# Patient Record
Sex: Female | Born: 1935 | Race: White | Hispanic: No | Smoking: Never smoker
Health system: Southern US, Community
[De-identification: ages and names within clinical notes are randomized; demographics above are authoritative.]

## PROBLEM LIST (undated history)

## (undated) DIAGNOSIS — I10 Essential (primary) hypertension: Secondary | ICD-10-CM

## (undated) DIAGNOSIS — E119 Type 2 diabetes mellitus without complications: Secondary | ICD-10-CM

## (undated) DIAGNOSIS — E079 Disorder of thyroid, unspecified: Secondary | ICD-10-CM

## (undated) DIAGNOSIS — I4891 Unspecified atrial fibrillation: Secondary | ICD-10-CM

---

## 1997-07-19 ENCOUNTER — Ambulatory Visit (HOSPITAL_COMMUNITY): Admission: RE | Admit: 1997-07-19 | Discharge: 1997-07-19 | Payer: Self-pay | Admitting: Family Medicine

## 2000-09-01 ENCOUNTER — Encounter: Payer: Self-pay | Admitting: Family Medicine

## 2000-09-01 ENCOUNTER — Ambulatory Visit (HOSPITAL_COMMUNITY): Admission: RE | Admit: 2000-09-01 | Discharge: 2000-09-01 | Payer: Self-pay | Admitting: Family Medicine

## 2000-09-02 ENCOUNTER — Ambulatory Visit (HOSPITAL_COMMUNITY): Admission: RE | Admit: 2000-09-02 | Discharge: 2000-09-02 | Payer: Self-pay | Admitting: Family Medicine

## 2000-09-02 ENCOUNTER — Encounter: Payer: Self-pay | Admitting: Family Medicine

## 2000-11-19 ENCOUNTER — Ambulatory Visit (HOSPITAL_COMMUNITY): Admission: RE | Admit: 2000-11-19 | Discharge: 2000-11-19 | Payer: Self-pay | Admitting: Family Medicine

## 2000-11-19 ENCOUNTER — Encounter: Payer: Self-pay | Admitting: Family Medicine

## 2002-02-02 ENCOUNTER — Inpatient Hospital Stay (HOSPITAL_COMMUNITY): Admission: EM | Admit: 2002-02-02 | Discharge: 2002-02-07 | Payer: Self-pay | Admitting: Emergency Medicine

## 2002-02-02 ENCOUNTER — Encounter: Payer: Self-pay | Admitting: Orthopedic Surgery

## 2002-02-02 ENCOUNTER — Encounter: Payer: Self-pay | Admitting: Emergency Medicine

## 2004-04-23 ENCOUNTER — Inpatient Hospital Stay (HOSPITAL_COMMUNITY): Admission: RE | Admit: 2004-04-23 | Discharge: 2004-04-26 | Payer: Self-pay | Admitting: Orthopedic Surgery

## 2005-01-19 ENCOUNTER — Encounter: Admission: RE | Admit: 2005-01-19 | Discharge: 2005-01-19 | Payer: Self-pay | Admitting: Orthopedic Surgery

## 2005-06-03 ENCOUNTER — Encounter: Admission: RE | Admit: 2005-06-03 | Discharge: 2005-06-03 | Payer: Self-pay | Admitting: Orthopedic Surgery

## 2005-06-04 ENCOUNTER — Ambulatory Visit (HOSPITAL_BASED_OUTPATIENT_CLINIC_OR_DEPARTMENT_OTHER): Admission: RE | Admit: 2005-06-04 | Discharge: 2005-06-04 | Payer: Self-pay | Admitting: Orthopedic Surgery

## 2006-03-31 ENCOUNTER — Other Ambulatory Visit: Admission: RE | Admit: 2006-03-31 | Discharge: 2006-03-31 | Payer: Self-pay | Admitting: *Deleted

## 2008-10-18 ENCOUNTER — Other Ambulatory Visit: Admission: RE | Admit: 2008-10-18 | Discharge: 2008-10-18 | Payer: Self-pay | Admitting: Family Medicine

## 2010-02-02 ENCOUNTER — Encounter: Payer: Self-pay | Admitting: Gastroenterology

## 2010-05-30 NOTE — Discharge Summary (Signed)
   NAME:  Shelby Gay, Shelby Gay                        ACCOUNT NO.:  [id number]   MEDICAL RECORD NO.:  [id number]                   PATIENT TYPE:  INP   LOCATION:  [id number]                                 FACILITY:  [facility]   PHYSICIAN:  Anderson Malta, M.D.                 DATE OF BIRTH:  Jun 28, 1930   DATE OF ADMISSION:  02/02/2002  DATE OF DISCHARGE:  02/07/2002                                 DISCHARGE SUMMARY   DISCHARGE DIAGNOSIS:  1. Right ankle trimalleolar fracture.   SECONDARY DIAGNOSES:  1. Hypertension.  2. Arthritis.   OPERATIONS/PROCEDURES:  Open reduction and internal fixation of right ankle  fracture performed on 02/02/02.   HOSPITAL COURSE:  Shelby Gay is a 77 year old patient who injured her  right ankle on 02/02/02.  She underwent right ankle open reduction and  internal fixation on 02/02/02.  She tolerated the procedure well.  Internal  medicine consultation was obtained.  The patient's toes were mobile and  perfused on postoperative day number one.   The patient was started on Coumadin for DVT prophylaxis.  Physical therapy  saw the patient for mobilization, non weight bearing on the right lower  extremity.  Postoperative day three showed the hardware  to be in good  position.  The patient had an uneventful recovery.  She was medically stable  by postoperative day number three.  She was discharged home on 02/07/02 in  good condition.   DISCHARGE MEDICATIONS:  Percocet and Coumadin plus her preadmission  medications.   FOLLOW UP:  She will follow up in my office in a week for inspection of the  incisions.  She will maintain a non weight bearing status.                                               Anderson Malta, M.D.    GSD/MEDQ  D:  03/14/2002  T:  03/15/2002  Job:  [id number]

## 2010-05-30 NOTE — Op Note (Signed)
NAMELAJEANNA, LANCON              ACCOUNT NO.:  [id number]   MEDICAL RECORD NO.:  [id number]          PATIENT TYPE:  INP   LOCATION:  2550                         FACILITY:  [facility]   PHYSICIAN:  Newt Minion, MD     DATE OF BIRTH:  March 10, 1930   DATE OF PROCEDURE:  04/23/2004  DATE OF DISCHARGE:                                 OPERATIVE REPORT   PREOPERATIVE DIAGNOSES:  1.  Status post ORIF right ankle fracture.  2.  Right ankle arthrosis.   PROCEDURE:  1.  Removal retained hardware.  2.  Open reduction internal fixation right ankle.   SURGEON.:  Newt Minion, MD   ANESTHESIA:  General.   ESTIMATED BLOOD LOSS:  Minimal.   ANTIBIOTICS:  1 gram of Kefzol.   DRAINS:  None.   COMPLICATIONS:  None.   TOURNIQUET TIME:  Esmarch at the ankle for approximately 60 minutes.   DISPOSITION:  To PACU in stable condition.   INDICATIONS FOR PROCEDURE:  The patient is a 77 year old woman who is status  post ORIF of a right ankle fracture. She had subsequently developed  arthrosis and pain with activities of daily living and presents at this time  for right ankle fusion. The risks and benefits were discussed including  infection, neurovascular injury, persistent pain, need for additional  surgery, nonhealing of the wounds. The patient states she understands and  wishes to proceed at this time.   DESCRIPTION OF PROCEDURE:  The patient is brought to OR room 5 underwent  general anesthetic.  After an adequate level of anesthesia was obtained, the  patient's right lower extremity was prepped using DuraPrep, draped in a  sterile field.  [name] was used to cover all exposed skin. An Esmarch was  wrapped around the calf for tourniquet control. The lateral incision was  used and this was carried down to the hardware.  The hardware was removed.  The distal fibula was excised. A similar incision was made over the previous  surgical incision medially and a medial malleolus was also excised.  The foot  was held in 90 degrees and the distal tibia and talar dome were then planed  using oscillating saw with parallel saw cuts with the ankle at 90 degrees.  The bone was removed. The wound was irrigated with normal saline and the  ankle joint was compressed with good bony contact of the ankle at 90  degrees.  A locking humeral plate was applied laterally to stabilize the  ankle laterally and a 7.3 cannulated screw was placed medially to stabilize  the ankle medially. C-arm fluoroscopy was used to verify reduction in both  AP and lateral planes.  Again the wound was irrigated. Using the AlloMatrix  demineralized bone matrix and bone graft, the fusion site was packed with  bone graft posteriorly. The Esmarch was released. Hemostasis was obtained.  The subcu was closed using 2-0 Vicryl.  The skin was closed using Proximate  staples. The wound was covered with Adaptic orthopedic sponges, ABD  dressing, Webril and Coban. The patient was extubated, taken to PACU in  stable condition.  MVD/MEDQ  D:  04/23/2004  T:  04/23/2004  Job:  [id number]

## 2010-05-30 NOTE — Op Note (Signed)
NAMEDEONICA, VANTOL              ACCOUNT NO.:  [id number]   MEDICAL RECORD NO.:  [id number]          PATIENT TYPE:  AMB   LOCATION:  [place]                          FACILITY:  [facility]   PHYSICIAN:  Youlanda Mighty. Sypher, M.D. DATE OF BIRTH:  [birth year]   DATE OF PROCEDURE:  06/04/2005  DATE OF DISCHARGE:                                 OPERATIVE REPORT   PREOP DIAGNOSIS:  1.  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  2.  Chronic left long finger stenosing tenosynovitis.  3.  Chronic left ring finger stenosing tenosynovitis.   POSTOP DIAGNOSIS:  1.  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  2.  Chronic left long finger stenosing tenosynovitis.  3.  Chronic left ring finger stenosing tenosynovitis.   OPERATION:  1.  Release of left transverse carpal ligament.  2.  Through separate incision a release of left long finger A1 pulley and      synovectomy of flexor tendons.  3.  Release of a right ring finger A1 pulley.   OPERATING SURGEON:  Youlanda Mighty. Sypher, M.D.   ASSISTANT:  Marily Lente. Dasnoit, P.A.-C.   ANESTHESIA:  General by LMA.   SUPERVISING ANESTHESIOLOGIST:  Leda Quail, M.D.   INDICATIONS:  Shelby Gay is a 77 year old woman referred for evaluation  and management of bilateral hand symptoms.  Clinical examination revealed  stenosing tenosynovitis of her right ring finger; and her left long finger  and signs of entrapment neuropathy.  Due to a failure to respond to  nonoperative measures; she is brought to the operating room at this time for  release of her right ring finger A1 pulley, her right long finger A1 pulley,  and release of her left transverse carpal ligament.  After informed consent,  she is brought to the operating room at this time.   PROCEDURE:  Maret Diaz is brought to the operating room and placed in  the supine position on the operating table.  Following the induction of  general anesthesia by LMA technique.  The right  and left arms were prepped  with Betadine soap and solution and sterilely draped.  A pneumatic  tourniquet was applied to the proximal right forearm and the proximal left  brachium.  After exsanguination of the right hand with an Esmarch bandage, the arterial  tourniquet was inflated to 270 mmHg due to systolic hypertension.   The procedure commenced with a short transverse incision directly over the  thickened pulley.  The subcutaneous tissues were carefully divided taking  care to identify the proper digital nerves to the ring finger which were  gently retracted.  The A1 pulley was cleared with a Valora Corporal and released with  scalpel and scissors along its radial border.  The wound was irrigated and  subsequently repaired with intradermal 3-0 Prolene suture.   Attention was then directed to the left hand.  The left arm was  exsanguinated with an Esmarch bandage; and the arterial tourniquet in the  proximal brachium was inflated to 270 mmHg.  The procedure commenced with a  short incision in the line of the ring  finger and the palm.  The  subcutaneous tissues were carefully divided below the palmar fascia.  This  was split longitudinally down to the comprehensive branches of the median  nerve.  These were followed back to transverse carpal ligament which was  gently isolated from the median nerve with a Penfield-4 elevator.  The  ligament was released along its ulnar border extending into the distal  forearm.  This widely opened the carpal canal.  No masses or other  predicaments were noted.  Bleeding points along the margin of the released  ligament were electrocauterized with bipolar current followed by repair of  the skin with intradermal 3-0 Prolene.   Attention was directed to the left long finger.  A short oblique incision  was fashioned directly over the palpably thickened pulley.  The subcutaneous  tissues were carefully divided, taking care to gently retract the proper  digital  nerves to the left long finger.  The pulley was isolated and a Valora Corporal  was used to clear synovial tissues, followed by release of the pulley along  its radial border.  The flexion tendon was delivered.  The superficialis  tendon had considerable degenerative change and several fragments of  hypertrophic synovium.  These were gently cleaned with a rongeur.  Thereafter free range of motion of the finger was recovered with full  passive-flexion extension.   The palmar wound was then repaired with intradermal 3-0 Prolene.  Then 2%  lidocaine was infiltrated into the wound margins for postoperative  analgesia.  There no apparent complications.  Ms. Sneider was placed in a  compressive dressing on the right; and a compressive dressing to the volar  plaster splint on the left.  Tourniquet was released with immediate  capillary refill to the right and left hands.  There no apparent  complications.  Note, that she return to the office for followup in 1 week  for dressing change and suture removal.      Youlanda Mighty. Sypher, M.D.  Electronically Signed     RVS/MEDQ  D:  06/04/2005  T:  06/04/2005  Job:  [id number]

## 2010-05-30 NOTE — H&P (Signed)
   NAME:  Shelby Gay, HNAT                        ACCOUNT NO.:  [id number]   MEDICAL RECORD NO.:  [id number]                   PATIENT TYPE:  INP   LOCATION:  1856                                 FACILITY:  [facility]   PHYSICIAN:  Anderson Malta, M.D.                 DATE OF BIRTH:  02-05-1930   DATE OF ADMISSION:  02/02/2002  DATE OF DISCHARGE:                                HISTORY & PHYSICAL   CHIEF COMPLAINT:  Right Ankle pain.   HISTORY OF PRESENT ILLNESS:  The patient is a 77 year old ambulatory white  female who tripped on a wire at 1:30 today.  She complains of right ankle  pain.  She denies any other orthopedic complaints.   PAST MEDICAL HISTORY:  Notable for hypertension and arthritis.   PAST SURGICAL HISTORY:  Remote ovarian cyst excision.   CURRENT MEDICATIONS:  Norvasc and Relafen.   ALLERGIES:  She has no known drug allergies.   PRIMARY CARE PHYSICIAN:  Biagio Borg, M.D.  She has had yearly  physicals.   PHYSICAL EXAMINATION:  GENERAL:  She is alert and oriented x 3.  CHEST:  Clear to auscultation.  HEART:  Regular rate and rhythm.  ABDOMEN:  Benign.  EXTREMITIES:  Right lower extremity is splinted.  She does have intact  flexion and extension of the toes but decreased sensation on the dorsal  aspect of her foot.  Her foot is perfused.  She has no pain with range of  motion of the right or left knee or hip.  There is no effusion in either  knee.   LABORATORY DATA:  Chest x-ray is clear.  EKG shows questionable old septal  infarct.  X-ray shows a trimalleolar ankle fracture with mild displacement.   Hemoglobin 14.7, white count 7.5, platelets 138.  Sodium 143 and potassium  3.5.  BUN and creatinine 13 and 0.8.  PT and PT 13.7 and 29.   IMPRESSION:  Right trimalleolar ankle fracture.   PLAN:  Open reduction/internal fixation.  Risks and benefits are discussed.  Primary risk include infection, nerve and vessel damage, nonunion, malunion,  DVT, as well as  need for more surgery.  These complications are not all  inclusive.  The patient understands the risks and benefits and will proceed  with surgery.                                               Anderson Malta, M.D.    GSD/MEDQ  D:  02/02/2002  T:  02/02/2002  Job:  [id number]

## 2010-05-30 NOTE — Discharge Summary (Signed)
NAMEMALANIA, Shelby Gay              ACCOUNT NO.:  [id number]   MEDICAL RECORD NO.:  [id number]          PATIENT TYPE:  INP   LOCATION:  5022                         FACILITY:  [facility]   PHYSICIAN:  Newt Minion, MD     DATE OF BIRTH:  [birth year]   DATE OF ADMISSION:  04/23/2004  DATE OF DISCHARGE:  04/26/2004                                 DISCHARGE SUMMARY   DIAGNOSIS:  Right ankle arthritis.   PROCEDURE:  Right ankle fusion and removal of retained hardware.   CONDITION ON DISCHARGE:  Discharged to home in stable condition on aspirin  for DVT prophylaxis and Tylox for pain.   FOLLOW UP:  Plan to follow-up in office in one week.   HISTORY OF PRESENT ILLNESS:  The patient is a 77 year old with right ankle  arthritis status post internal fixation for ankle fracture. The patient has  had progressive pain with activities of daily living, has failed  conservative care, and presents at this time for surgical intervention. The  patient underwent removal of retained hardware and right ankle fusion on  April 23, 2004. The patient received Kefzol for infection prophylaxis and a  tourniquet time was 60 minutes with an esmarch at the ankle. The patient was  treated with prophylactic antibiotics for 24 hours. The patient progressed  well postoperatively. The patient states that there was a wheelchair at home  and anticipate the patient will need to use a wheelchair at discharge. The  patient was seen by physical therapy for transfer training. The patient was  slow with transfer training and was discharged to home in stable condition  on April 26, 2004 with follow-up in the office in one week.       MVD/MEDQ  D:  06/25/2004  T:  06/25/2004  Job:  [id number]

## 2010-05-30 NOTE — Op Note (Signed)
NAME:  Shelby Gay, Shelby Gay                        ACCOUNT NO.:  [id number]   MEDICAL RECORD NO.:  [id number]                   PATIENT TYPE:  INP   LOCATION:  5012                                 FACILITY:  [facility]   PHYSICIAN:  Anderson Malta, M.D.                 DATE OF BIRTH:  1930-07-01   DATE OF PROCEDURE:  DATE OF DISCHARGE:                                 OPERATIVE REPORT   PREOPERATIVE DIAGNOSIS:  Right trimalleolar ankle fracture.   POSTOPERATIVE DIAGNOSIS:  Right trimalleolar ankle fracture.   PROCEDURE:  Right ankle open reduction and internal fixation of the lateral  medial malleolus.   SURGEON:  Dr. Alphonzo Severance.   ANESTHESIA:  General endotracheal.   ESTIMATED BLOOD LOSS:  25 cc.   TOURNIQUET TIME:  68 minutes at 300 mmHg.   PROCEDURE IN DETAIL:  The patient was brought to the operating room where  general endotracheal anesthesia was induced.  Preoperative IV antibiotics  were administered.  Right ankle and leg were prepped with Duraprep solution  and draped in a sterile manner.  [name] was used to cover the operative  field.  The leg was elevated and exsanguinated with the Esmarch.  After  tourniquet was inflated, lateral incision to the fibula was made.  Skin and  subcutaneous tissue was sharply divided.  Superficial peroneal nerve was  identified and protected.  Full thickness periosteal flaps were developed  around the fracture site.  The fracture was then reduced under direct  visualization and a lag screw was placed.  An eight hole, one third tubular  locking plate was then placed in what was osteoporotic bone.  This gave an  excellent fixation which was checked in the AP and lateral planes under  fluoroscopy.  At this time, a medial J-shaped incision was made on the  medial side and was centered over the fracture site.  Skin and subcutaneous  tissues were sharply divided.  Branches of the saphenous vein were  preserved.  Fracture was identified and reduced  under direct visualization.  Two guide pins from the 4-0 cannulated screw set were placed.  Reduction was  confirmed in the AP and lateral planes.  Two 4 mm screws were then placed  across the fracture site with good compression and anatomic reduction  achieved.  At this time, a tourniquet was released.  Both incisions were  thoroughly irrigated.  Lateral incision was closed using interrupted,  inverted 2-0 Vicryl suture and skin with simple 3-0 nylon sutures.  Medial  side was also closed using interrupted inverted 3-0 Vicryl suture and 3-0  nylon suture.  The patient had a good pedal pulse and good perfusion.  The  patient tolerated the procedure well, without immediate complications.  She  was placed in a posterior splint with the ankle in neutral dorsiflexion.  Anderson Malta, M.D.    GSD/MEDQ  D:  02/06/2002  T:  02/06/2002  Job:  [id number]

## 2011-04-06 ENCOUNTER — Other Ambulatory Visit (HOSPITAL_COMMUNITY)
Admission: RE | Admit: 2011-04-06 | Discharge: 2011-04-06 | Disposition: A | Discharge status: Home / Self Care | Payer: Medicare Other | Source: Ambulatory Visit | Attending: Family Medicine | Admitting: Family Medicine

## 2011-04-06 ENCOUNTER — Other Ambulatory Visit: Payer: Self-pay | Admitting: Family Medicine

## 2011-04-06 DIAGNOSIS — Z124 Encounter for screening for malignant neoplasm of cervix: Secondary | ICD-10-CM | POA: Insufficient documentation

## 2012-11-02 ENCOUNTER — Other Ambulatory Visit (HOSPITAL_COMMUNITY): Payer: Self-pay | Admitting: Family Medicine

## 2012-11-02 ENCOUNTER — Ambulatory Visit (HOSPITAL_COMMUNITY)
Admission: RE | Admit: 2012-11-02 | Discharge: 2012-11-02 | Disposition: A | Discharge status: Home / Self Care | Payer: Medicare Other | Source: Ambulatory Visit | Attending: Family Medicine | Admitting: Family Medicine

## 2012-11-02 DIAGNOSIS — M79609 Pain in unspecified limb: Secondary | ICD-10-CM

## 2012-11-02 DIAGNOSIS — M25451 Effusion, right hip: Secondary | ICD-10-CM

## 2012-11-02 DIAGNOSIS — M25521 Pain in right elbow: Secondary | ICD-10-CM

## 2012-11-02 DIAGNOSIS — X58XXXA Exposure to other specified factors, initial encounter: Secondary | ICD-10-CM | POA: Insufficient documentation

## 2012-11-02 DIAGNOSIS — R229 Localized swelling, mass and lump, unspecified: Secondary | ICD-10-CM | POA: Insufficient documentation

## 2012-11-02 DIAGNOSIS — S40029A Contusion of unspecified upper arm, initial encounter: Secondary | ICD-10-CM | POA: Insufficient documentation

## 2012-11-02 NOTE — Progress Notes (Signed)
VASCULAR LAB PRELIMINARY  PRELIMINARY  PRELIMINARY  PRELIMINARY  Right upper extremity venous duplex completed.    Preliminary report:  Right:  No evidence of DVT or superficial thrombosis.  There is a discrete area in the forearm at the area of pain/lump measuring 0.9 x 1.2 x 1.9 cm.  It is approx 14mm deep.  No vasculariztion noted in this area.  Reeder Brisby, RVT 11/02/2012, 2:52 PM

## 2013-01-26 ENCOUNTER — Encounter (HOSPITAL_BASED_OUTPATIENT_CLINIC_OR_DEPARTMENT_OTHER): Payer: Self-pay | Admitting: Emergency Medicine

## 2013-01-26 ENCOUNTER — Emergency Department (HOSPITAL_BASED_OUTPATIENT_CLINIC_OR_DEPARTMENT_OTHER): Payer: Medicare PPO

## 2013-01-26 ENCOUNTER — Emergency Department (HOSPITAL_BASED_OUTPATIENT_CLINIC_OR_DEPARTMENT_OTHER)
Admission: EM | Admit: 2013-01-26 | Discharge: 2013-01-26 | Disposition: A | Discharge status: Home / Self Care | Payer: Medicare PPO | Attending: Emergency Medicine | Admitting: Emergency Medicine

## 2013-01-26 DIAGNOSIS — IMO0002 Reserved for concepts with insufficient information to code with codable children: Secondary | ICD-10-CM | POA: Insufficient documentation

## 2013-01-26 DIAGNOSIS — S8990XA Unspecified injury of unspecified lower leg, initial encounter: Secondary | ICD-10-CM | POA: Insufficient documentation

## 2013-01-26 DIAGNOSIS — I4891 Unspecified atrial fibrillation: Secondary | ICD-10-CM | POA: Insufficient documentation

## 2013-01-26 DIAGNOSIS — M25562 Pain in left knee: Secondary | ICD-10-CM

## 2013-01-26 DIAGNOSIS — W19XXXA Unspecified fall, initial encounter: Secondary | ICD-10-CM

## 2013-01-26 DIAGNOSIS — W010XXA Fall on same level from slipping, tripping and stumbling without subsequent striking against object, initial encounter: Secondary | ICD-10-CM | POA: Insufficient documentation

## 2013-01-26 DIAGNOSIS — Y939 Activity, unspecified: Secondary | ICD-10-CM | POA: Insufficient documentation

## 2013-01-26 DIAGNOSIS — M546 Pain in thoracic spine: Secondary | ICD-10-CM

## 2013-01-26 DIAGNOSIS — Z79899 Other long term (current) drug therapy: Secondary | ICD-10-CM | POA: Insufficient documentation

## 2013-01-26 DIAGNOSIS — S99929A Unspecified injury of unspecified foot, initial encounter: Secondary | ICD-10-CM

## 2013-01-26 DIAGNOSIS — Z791 Long term (current) use of non-steroidal anti-inflammatories (NSAID): Secondary | ICD-10-CM | POA: Insufficient documentation

## 2013-01-26 DIAGNOSIS — I10 Essential (primary) hypertension: Secondary | ICD-10-CM | POA: Insufficient documentation

## 2013-01-26 DIAGNOSIS — E079 Disorder of thyroid, unspecified: Secondary | ICD-10-CM | POA: Insufficient documentation

## 2013-01-26 DIAGNOSIS — E119 Type 2 diabetes mellitus without complications: Secondary | ICD-10-CM | POA: Insufficient documentation

## 2013-01-26 DIAGNOSIS — S99919A Unspecified injury of unspecified ankle, initial encounter: Secondary | ICD-10-CM

## 2013-01-26 DIAGNOSIS — Y929 Unspecified place or not applicable: Secondary | ICD-10-CM | POA: Insufficient documentation

## 2013-01-26 HISTORY — DX: Disorder of thyroid, unspecified: E07.9

## 2013-01-26 HISTORY — DX: Type 2 diabetes mellitus without complications: E11.9

## 2013-01-26 HISTORY — DX: Essential (primary) hypertension: I10

## 2013-01-26 HISTORY — DX: Unspecified atrial fibrillation: I48.91

## 2013-01-26 MED ORDER — HYDROCODONE-ACETAMINOPHEN 5-325 MG PO TABS
1.0000 | ORAL_TABLET | Freq: Once | ORAL | Status: AC
  Administered 2013-01-26: 1 via ORAL
  Filled 2013-01-26: qty 1

## 2013-01-26 MED ORDER — HYDROCODONE-ACETAMINOPHEN 5-325 MG PO TABS: 1.0000 | ORAL_TABLET | Freq: Four times a day (QID) | ORAL | Status: AC | PRN

## 2013-01-26 NOTE — ED Provider Notes (Signed)
TIME SEEN: 11:00 AM  CHIEF COMPLAINT: Fall, right posterior back pain, left knee pain  HPI: Patient is a 80 year old female who has a history of diabetes, A. fib not on anticoagulation, hypertension, hypothyroidism who presents emergency department after a mechanical fall yesterday morning. She reports she slipped on ice on her rehabilitation and fell hurting her left knee and right posterior back. She was able to get up off the ground and ambulate afterwards. She states she has pain with movement. Pain is better with rest. She denies any head injury. She is not on anticoagulation. She denies any numbness, tingling or focal weakness. No bowel or bladder incontinence. No shortness of breath.  ROS: See HPI Constitutional: no fever  Eyes: no drainage  ENT: no runny nose   Cardiovascular:  no chest pain  Resp: no SOB  GI: no vomiting GU: no dysuria Integumentary: no rash  Allergy: no hives  Musculoskeletal: no leg swelling  Neurological: no slurred speech ROS otherwise negative  PAST MEDICAL HISTORY/PAST SURGICAL HISTORY:  Past Medical History  Diagnosis Date  . Atrial fibrillation   . Diabetes mellitus without complication   . Hypertension   . Thyroid disease     MEDICATIONS:  Prior to Admission medications   Medication Sig Start Date End Date Taking? Authorizing Provider  alendronate (FOSAMAX) 40 MG tablet Take 40 mg by mouth every 7 (seven) days. Take with a full glass of water on an empty stomach.   Yes Historical Provider, MD  amiodarone (PACERONE) 100 MG tablet Take 100 mg by mouth daily.   Yes Historical Provider, MD  aspirin 81 MG tablet Take 81 mg by mouth daily.   Yes Historical Provider, MD  cyclobenzaprine (FLEXERIL) 10 MG tablet Take 10 mg by mouth 3 (three) times daily as needed for muscle spasms.   Yes Historical Provider, MD  diltiazem (CARDIZEM CD) 180 MG 24 hr capsule Take 180 mg by mouth daily.   Yes Historical Provider, MD  levothyroxine (SYNTHROID, LEVOTHROID) 50  MCG tablet Take 50 mcg by mouth daily before breakfast.   Yes Historical Provider, MD  losartan-hydrochlorothiazide (HYZAAR) 100-25 MG per tablet Take 1 tablet by mouth daily.   Yes Historical Provider, MD  metFORMIN (GLUCOPHAGE) 500 MG tablet Take by mouth 2 (two) times daily with a meal.   Yes Historical Provider, MD  naproxen (NAPROSYN) 500 MG tablet Take 500 mg by mouth 2 (two) times daily with a meal.   Yes Historical Provider, MD  pravastatin (PRAVACHOL) 40 MG tablet Take 40 mg by mouth daily.   Yes Historical Provider, MD    ALLERGIES:  Not on File  SOCIAL HISTORY:  History  Substance Use Topics  . Smoking status: Never Smoker   . Smokeless tobacco: Not on file  . Alcohol Use: No    FAMILY HISTORY: History reviewed. No pertinent family history.  EXAM: BP 151/91  Pulse 86  Temp(Src) 98.2 F (36.8 C) (Oral)  Resp 20  Ht 5' (1.524 m)  Wt 150 lb (68.04 kg)  BMI 29.30 kg/m2  SpO2 100% CONSTITUTIONAL: Alert and oriented and responds appropriately to questions. Well-appearing; well-nourished; GCS 15 HEAD: Normocephalic; atraumatic EYES: Conjunctivae clear, PERRL, EOMI ENT: normal nose; no rhinorrhea; moist mucous membranes; pharynx without lesions noted; no dental injury; no septal hematoma NECK: Supple, no meningismus, no LAD; no midline spinal tenderness, step-off or deformity CARD: RRR; S1 and S2 appreciated; no murmurs, no clicks, no rubs, no gallops RESP: Normal chest excursion without splinting or tachypnea; breath sounds  clear and equal bilaterally; no wheezes, no rhonchi, no rales; chest wall stable, nontender to palpation ABD/GI: Normal bowel sounds; non-distended; soft, non-tender, no rebound, no guarding PELVIS:  stable, nontender to palpation BACK:  The back appears normal and patient is tender to palpation over the right posterior ribs with no ecchymosis or crepitus or skin lesions, there is no CVA tenderness; no midline spinal tenderness, step-off or  deformity EXT: Patient is tender to palpation over the medial joint line of the left knee and over the patella with minimal amount of medial swelling, no ecchymosis, full range of motion in her left knee, small joint effusion, no erythema or warmth or induration, 2+ DP pulses bilaterally, sensation to light touch intact diffusely, otherwise Normal ROM in all joints; non-tender to palpation; no edema; normal capillary refill; no cyanosis    SKIN: Normal color for age and race; warm NEURO: Moves all extremities equally normal gait, sensation to light touch intact diffusely, cranial nerves 2 through intact PSYCH: The patient's mood and manner are appropriate. Grooming and personal hygiene are appropriate.  MEDICAL DECISION MAKING: Patient here with mechanical fall. She is complaining of right posterior rib pain and left knee pain. We'll obtain x-rays and give pain medication. She is hemodynamically stable.  ED PROGRESS: Patient's x-ray show no acute fracture dislocation. She does have tricompartmental are separated seen on her x-ray of her knee in a moderate joint effusion.  Patient reports her pain is improved with Vicodin. We'll discharge home with return precautions, followup information, pain medication. Patient and daughter at bedside verbalize understanding and are comfortable plan.     [place], DO 01/26/13 1157

## 2013-01-26 NOTE — Discharge Instructions (Signed)
Knee Pain Knee pain can be a result of an injury or other medical conditions. Treatment will depend on the cause of your pain. HOME CARE  Only take medicine as told by your doctor.  Keep a healthy weight. Being overweight can make the knee hurt more.  Stretch before exercising or playing sports.  If there is constant knee pain, change the way you exercise. Ask your doctor for advice.  Make sure shoes fit well. Choose the right shoe for the sport or activity.  Protect your knees. Wear kneepads if needed.  Rest when you are tired. GET HELP RIGHT AWAY IF:   Your knee pain does not stop.  Your knee pain does not get better.  Your knee joint feels hot to the touch.  You have a fever. MAKE SURE YOU:   Understand these instructions.  Will watch this condition.  Will get help right away if you are not doing well or get worse. Document Released: 03/27/2008 Document Revised: 03/23/2011 Document Reviewed: 03/27/2008 [facility] Patient Information 2014 [place], [place]. RICE: Routine Care for Injuries The routine care of many injuries includes Rest, Ice, Compression, and Elevation (RICE). HOME CARE INSTRUCTIONS  Rest is needed to allow your body to heal. Routine activities can usually be resumed when comfortable. Injured tendons and bones can take up to 6 weeks to heal. Tendons are the cord-like structures that attach muscle to bone.  Ice following an injury helps keep the swelling down and reduces pain.  Put ice in a plastic bag.  Place a towel between your skin and the bag.  Leave the ice on for 15-20 minutes, 03-04 times a day. Do this while awake, for the first 24 to 48 hours. After that, continue as directed by your caregiver.  Compression helps keep swelling down. It also gives support and helps with discomfort. If an elastic bandage has been applied, it should be removed and reapplied every 3 to 4 hours. It should not be applied tightly, but firmly enough to keep swelling  down. Watch fingers or toes for swelling, bluish discoloration, coldness, numbness, or excessive pain. If any of these problems occur, remove the bandage and reapply loosely. Contact your caregiver if these problems continue.  Elevation helps reduce swelling and decreases pain. With extremities, such as the arms, hands, legs, and feet, the injured area should be placed near or above the level of the heart, if possible. SEEK IMMEDIATE MEDICAL CARE IF:  You have persistent pain and swelling.  You develop redness, numbness, or unexpected weakness.  Your symptoms are getting worse rather than improving after several days. These symptoms may indicate that further evaluation or further X-rays are needed. Sometimes, X-rays may not show a small broken bone (fracture) until 1 week or 10 days later. Make a follow-up appointment with your caregiver. Ask when your X-ray results will be ready. Make sure you get your X-ray results. Document Released: 04/12/2000 Document Revised: 03/23/2011 Document Reviewed: 05/30/2010 [facility] Patient Information 2014 [place], [place]. Back Pain, Adult Low back pain is very common. About 1 in 5 people have back pain.The cause of low back pain is rarely dangerous. The pain often gets better over time.About half of people with a sudden onset of back pain feel better in just 2 weeks. About 8 in 10 people feel better by 6 weeks.  CAUSES Some common causes of back pain include:  Strain of the muscles or ligaments supporting the spine.  Wear and tear (degeneration) of the spinal discs.  Arthritis.  Direct injury to the back. DIAGNOSIS Most of the time, the direct cause of low back pain is not known.However, back pain can be treated effectively even when the exact cause of the pain is unknown.Answering your caregiver's questions about your overall health and symptoms is one of the most accurate ways to make sure the cause of your pain is not dangerous. If your caregiver  needs more information, he or she may order lab work or imaging tests (X-rays or MRIs).However, even if imaging tests show changes in your back, this usually does not require surgery. HOME CARE INSTRUCTIONS For many people, back pain returns.Since low back pain is rarely dangerous, it is often a condition that people can learn to [facility] their own.   Remain active. It is stressful on the back to sit or stand in one place. Do not sit, drive, or stand in one place for more than 30 minutes at a time. Take short walks on level surfaces as soon as pain allows.Try to increase the length of time you walk each day.  Do not stay in bed.Resting more than 1 or 2 days can delay your recovery.  Do not avoid exercise or work.Your body is made to move.It is not dangerous to be active, even though your back may hurt.Your back will likely heal faster if you return to being active before your pain is gone.  Pay attention to your body when you bend and lift. Many people have less discomfortwhen lifting if they bend their knees, keep the load close to their bodies,and avoid twisting. Often, the most comfortable positions are those that put less stress on your recovering back.  Find a comfortable position to sleep. Use a firm mattress and lie on your side with your knees slightly bent. If you lie on your back, put a pillow under your knees.  Only take over-the-counter or prescription medicines as directed by your caregiver. Over-the-counter medicines to reduce pain and inflammation are often the most helpful.Your caregiver may prescribe muscle relaxant drugs.These medicines help dull your pain so you can more quickly return to your normal activities and healthy exercise.  Put ice on the injured area.  Put ice in a plastic bag.  Place a towel between your skin and the bag.  Leave the ice on for 15-20 minutes, 03-04 times a day for the first 2 to 3 days. After that, ice and heat may be alternated to  reduce pain and spasms.  Ask your caregiver about trying back exercises and gentle massage. This may be of some benefit.  Avoid feeling anxious or stressed.Stress increases muscle tension and can worsen back pain.It is important to recognize when you are anxious or stressed and learn ways to manage it.Exercise is a great option. SEEK MEDICAL CARE IF:  You have pain that is not relieved with rest or medicine.  You have pain that does not improve in 1 week.  You have new symptoms.  You are generally not feeling well. SEEK IMMEDIATE MEDICAL CARE IF:   You have pain that radiates from your back into your legs.  You develop new bowel or bladder control problems.  You have unusual weakness or numbness in your arms or legs.  You develop nausea or vomiting.  You develop abdominal pain.  You feel faint. Document Released: 12/29/2004 Document Revised: 06/30/2011 Document Reviewed: 05/19/2010 [facility] Patient Information 2014 [place], [place]. Contusion A contusion is a deep bruise. Contusions are the result of an injury that caused bleeding under the skin.  The contusion may turn blue, purple, or yellow. Minor injuries will give you a painless contusion, but more severe contusions may stay painful and swollen for a few weeks.  CAUSES  A contusion is usually caused by a blow, trauma, or direct force to an area of the body. SYMPTOMS   Swelling and redness of the injured area.  Bruising of the injured area.  Tenderness and soreness of the injured area.  Pain. DIAGNOSIS  The diagnosis can be made by taking a history and physical exam. An X-ray, CT scan, or MRI may be needed to determine if there were any associated injuries, such as fractures. TREATMENT  Specific treatment will depend on what area of the body was injured. In general, the best treatment for a contusion is resting, icing, elevating, and applying cold compresses to the injured area. Over-the-counter medicines may also be  recommended for pain control. Ask your caregiver what the best treatment is for your contusion. HOME CARE INSTRUCTIONS   Put ice on the injured area.  Put ice in a plastic bag.  Place a towel between your skin and the bag.  Leave the ice on for 15-20 minutes, 03-04 times a day.  Only take over-the-counter or prescription medicines for pain, discomfort, or fever as directed by your caregiver. Your caregiver may recommend avoiding anti-inflammatory medicines (aspirin, ibuprofen, and naproxen) for 48 hours because these medicines may increase bruising.  Rest the injured area.  If possible, elevate the injured area to reduce swelling. SEEK IMMEDIATE MEDICAL CARE IF:   You have increased bruising or swelling.  You have pain that is getting worse.  Your swelling or pain is not relieved with medicines. MAKE SURE YOU:   Understand these instructions.  Will watch your condition.  Will get help right away if you are not doing well or get worse. Document Released: 10/08/2004 Document Revised: 03/23/2011 Document Reviewed: 11/03/2010 [facility] Patient Information 2014 [place], [place].

## 2013-01-26 NOTE — ED Notes (Signed)
Patient transported to X-ray 

## 2013-08-11 ENCOUNTER — Encounter: Payer: Self-pay | Admitting: Internal Medicine

## 2013-08-15 ENCOUNTER — Other Ambulatory Visit: Payer: Self-pay | Admitting: Family Medicine

## 2013-08-15 ENCOUNTER — Ambulatory Visit
Admission: RE | Admit: 2013-08-15 | Discharge: 2013-08-15 | Disposition: A | Discharge status: Home / Self Care | Payer: Commercial Managed Care - HMO | Source: Ambulatory Visit | Attending: Family Medicine | Admitting: Family Medicine

## 2013-08-15 DIAGNOSIS — M62830 Muscle spasm of back: Secondary | ICD-10-CM

## 2013-11-19 ENCOUNTER — Encounter: Payer: Self-pay | Admitting: *Deleted

## 2014-03-14 ENCOUNTER — Encounter: Payer: Self-pay | Admitting: Internal Medicine

## 2014-03-14 DIAGNOSIS — E1122 Type 2 diabetes mellitus with diabetic chronic kidney disease: Secondary | ICD-10-CM | POA: Diagnosis not present

## 2014-03-14 DIAGNOSIS — E780 Pure hypercholesterolemia: Secondary | ICD-10-CM | POA: Diagnosis not present

## 2014-03-14 DIAGNOSIS — I129 Hypertensive chronic kidney disease with stage 1 through stage 4 chronic kidney disease, or unspecified chronic kidney disease: Secondary | ICD-10-CM | POA: Diagnosis not present

## 2014-03-14 DIAGNOSIS — N183 Chronic kidney disease, stage 3 (moderate): Secondary | ICD-10-CM | POA: Diagnosis not present

## 2014-03-27 DIAGNOSIS — N183 Chronic kidney disease, stage 3 (moderate): Secondary | ICD-10-CM | POA: Diagnosis not present

## 2014-04-16 DIAGNOSIS — N183 Chronic kidney disease, stage 3 (moderate): Secondary | ICD-10-CM | POA: Diagnosis not present

## 2014-04-17 DIAGNOSIS — I480 Paroxysmal atrial fibrillation: Secondary | ICD-10-CM | POA: Diagnosis not present

## 2014-04-17 DIAGNOSIS — R0789 Other chest pain: Secondary | ICD-10-CM | POA: Diagnosis not present

## 2014-04-17 DIAGNOSIS — I350 Nonrheumatic aortic (valve) stenosis: Secondary | ICD-10-CM | POA: Diagnosis not present

## 2014-04-24 DIAGNOSIS — H4011X2 Primary open-angle glaucoma, moderate stage: Secondary | ICD-10-CM | POA: Diagnosis not present

## 2014-05-01 DIAGNOSIS — I350 Nonrheumatic aortic (valve) stenosis: Secondary | ICD-10-CM | POA: Diagnosis not present

## 2014-05-01 DIAGNOSIS — E039 Hypothyroidism, unspecified: Secondary | ICD-10-CM | POA: Diagnosis not present

## 2014-05-03 DIAGNOSIS — I480 Paroxysmal atrial fibrillation: Secondary | ICD-10-CM | POA: Diagnosis not present

## 2014-05-03 DIAGNOSIS — I10 Essential (primary) hypertension: Secondary | ICD-10-CM | POA: Diagnosis not present

## 2014-05-03 DIAGNOSIS — I350 Nonrheumatic aortic (valve) stenosis: Secondary | ICD-10-CM | POA: Diagnosis not present

## 2014-05-16 DIAGNOSIS — I129 Hypertensive chronic kidney disease with stage 1 through stage 4 chronic kidney disease, or unspecified chronic kidney disease: Secondary | ICD-10-CM | POA: Diagnosis not present

## 2014-05-16 DIAGNOSIS — N183 Chronic kidney disease, stage 3 (moderate): Secondary | ICD-10-CM | POA: Diagnosis not present

## 2014-07-31 DIAGNOSIS — I129 Hypertensive chronic kidney disease with stage 1 through stage 4 chronic kidney disease, or unspecified chronic kidney disease: Secondary | ICD-10-CM | POA: Diagnosis not present

## 2014-07-31 DIAGNOSIS — M25532 Pain in left wrist: Secondary | ICD-10-CM | POA: Diagnosis not present

## 2014-08-01 ENCOUNTER — Other Ambulatory Visit: Payer: Self-pay | Admitting: Family Medicine

## 2014-08-01 ENCOUNTER — Ambulatory Visit
Admission: RE | Admit: 2014-08-01 | Discharge: 2014-08-01 | Disposition: A | Discharge status: Home / Self Care | Payer: Commercial Managed Care - HMO | Source: Ambulatory Visit | Attending: Family Medicine | Admitting: Family Medicine

## 2014-08-01 DIAGNOSIS — M25532 Pain in left wrist: Secondary | ICD-10-CM

## 2014-08-01 DIAGNOSIS — M1812 Unilateral primary osteoarthritis of first carpometacarpal joint, left hand: Secondary | ICD-10-CM | POA: Diagnosis not present

## 2014-08-27 ENCOUNTER — Other Ambulatory Visit: Payer: Self-pay | Admitting: Nephrology

## 2014-08-27 DIAGNOSIS — N184 Chronic kidney disease, stage 4 (severe): Secondary | ICD-10-CM | POA: Diagnosis not present

## 2014-08-27 DIAGNOSIS — N390 Urinary tract infection, site not specified: Secondary | ICD-10-CM | POA: Diagnosis not present

## 2014-08-27 DIAGNOSIS — I129 Hypertensive chronic kidney disease with stage 1 through stage 4 chronic kidney disease, or unspecified chronic kidney disease: Secondary | ICD-10-CM | POA: Diagnosis not present

## 2014-08-27 DIAGNOSIS — E780 Pure hypercholesterolemia: Secondary | ICD-10-CM | POA: Diagnosis not present

## 2014-08-27 DIAGNOSIS — E1129 Type 2 diabetes mellitus with other diabetic kidney complication: Secondary | ICD-10-CM | POA: Diagnosis not present

## 2014-08-30 ENCOUNTER — Ambulatory Visit
Admission: RE | Admit: 2014-08-30 | Discharge: 2014-08-30 | Disposition: A | Discharge status: Home / Self Care | Payer: Commercial Managed Care - HMO | Source: Ambulatory Visit | Attending: Nephrology | Admitting: Nephrology

## 2014-08-30 DIAGNOSIS — N261 Atrophy of kidney (terminal): Secondary | ICD-10-CM | POA: Diagnosis not present

## 2014-08-30 DIAGNOSIS — N184 Chronic kidney disease, stage 4 (severe): Secondary | ICD-10-CM

## 2014-08-30 DIAGNOSIS — N189 Chronic kidney disease, unspecified: Secondary | ICD-10-CM | POA: Diagnosis not present

## 2014-09-19 DIAGNOSIS — N390 Urinary tract infection, site not specified: Secondary | ICD-10-CM | POA: Diagnosis not present

## 2014-09-19 DIAGNOSIS — M1990 Unspecified osteoarthritis, unspecified site: Secondary | ICD-10-CM | POA: Diagnosis not present

## 2014-09-19 DIAGNOSIS — E1129 Type 2 diabetes mellitus with other diabetic kidney complication: Secondary | ICD-10-CM | POA: Diagnosis not present

## 2014-09-19 DIAGNOSIS — I129 Hypertensive chronic kidney disease with stage 1 through stage 4 chronic kidney disease, or unspecified chronic kidney disease: Secondary | ICD-10-CM | POA: Diagnosis not present

## 2014-09-19 DIAGNOSIS — N184 Chronic kidney disease, stage 4 (severe): Secondary | ICD-10-CM | POA: Diagnosis not present

## 2014-09-26 ENCOUNTER — Other Ambulatory Visit: Payer: Self-pay | Admitting: Nephrology

## 2014-09-26 DIAGNOSIS — N184 Chronic kidney disease, stage 4 (severe): Secondary | ICD-10-CM

## 2014-09-30 DIAGNOSIS — R0600 Dyspnea, unspecified: Secondary | ICD-10-CM | POA: Diagnosis not present

## 2014-10-01 DIAGNOSIS — Z23 Encounter for immunization: Secondary | ICD-10-CM | POA: Diagnosis not present

## 2014-10-01 DIAGNOSIS — J209 Acute bronchitis, unspecified: Secondary | ICD-10-CM | POA: Diagnosis not present

## 2014-10-01 DIAGNOSIS — R011 Cardiac murmur, unspecified: Secondary | ICD-10-CM | POA: Diagnosis not present

## 2014-10-01 DIAGNOSIS — R609 Edema, unspecified: Secondary | ICD-10-CM | POA: Diagnosis not present

## 2014-10-01 DIAGNOSIS — M654 Radial styloid tenosynovitis [de Quervain]: Secondary | ICD-10-CM | POA: Diagnosis not present

## 2014-10-02 ENCOUNTER — Inpatient Hospital Stay (HOSPITAL_COMMUNITY): Admission: RE | Admit: 2014-10-02 | Payer: Commercial Managed Care - HMO | Source: Ambulatory Visit

## 2014-10-03 ENCOUNTER — Ambulatory Visit (HOSPITAL_COMMUNITY)
Admission: RE | Admit: 2014-10-03 | Discharge: 2014-10-03 | Disposition: A | Discharge status: Home / Self Care | Payer: Commercial Managed Care - HMO | Source: Ambulatory Visit | Attending: Cardiovascular Disease | Admitting: Cardiovascular Disease

## 2014-10-03 DIAGNOSIS — N184 Chronic kidney disease, stage 4 (severe): Secondary | ICD-10-CM | POA: Insufficient documentation

## 2014-10-03 DIAGNOSIS — I129 Hypertensive chronic kidney disease with stage 1 through stage 4 chronic kidney disease, or unspecified chronic kidney disease: Secondary | ICD-10-CM | POA: Diagnosis not present

## 2014-10-03 DIAGNOSIS — N270 Small kidney, unilateral: Secondary | ICD-10-CM | POA: Insufficient documentation

## 2014-10-03 DIAGNOSIS — E119 Type 2 diabetes mellitus without complications: Secondary | ICD-10-CM | POA: Diagnosis not present

## 2014-10-03 DIAGNOSIS — E785 Hyperlipidemia, unspecified: Secondary | ICD-10-CM | POA: Diagnosis not present

## 2014-10-30 DIAGNOSIS — H401132 Primary open-angle glaucoma, bilateral, moderate stage: Secondary | ICD-10-CM | POA: Diagnosis not present

## 2014-10-30 DIAGNOSIS — E119 Type 2 diabetes mellitus without complications: Secondary | ICD-10-CM | POA: Diagnosis not present

## 2014-11-07 DIAGNOSIS — M654 Radial styloid tenosynovitis [de Quervain]: Secondary | ICD-10-CM | POA: Diagnosis not present

## 2014-12-05 DIAGNOSIS — I480 Paroxysmal atrial fibrillation: Secondary | ICD-10-CM | POA: Diagnosis not present

## 2014-12-05 DIAGNOSIS — I700 Atherosclerosis of aorta: Secondary | ICD-10-CM | POA: Diagnosis not present

## 2014-12-05 DIAGNOSIS — I2510 Atherosclerotic heart disease of native coronary artery without angina pectoris: Secondary | ICD-10-CM | POA: Diagnosis not present

## 2014-12-05 DIAGNOSIS — R0602 Shortness of breath: Secondary | ICD-10-CM | POA: Diagnosis not present

## 2014-12-05 DIAGNOSIS — Z79899 Other long term (current) drug therapy: Secondary | ICD-10-CM | POA: Diagnosis not present

## 2014-12-05 DIAGNOSIS — E039 Hypothyroidism, unspecified: Secondary | ICD-10-CM | POA: Diagnosis not present

## 2014-12-12 DIAGNOSIS — M654 Radial styloid tenosynovitis [de Quervain]: Secondary | ICD-10-CM | POA: Diagnosis not present

## 2014-12-13 DIAGNOSIS — E1122 Type 2 diabetes mellitus with diabetic chronic kidney disease: Secondary | ICD-10-CM | POA: Diagnosis not present

## 2014-12-13 DIAGNOSIS — K760 Fatty (change of) liver, not elsewhere classified: Secondary | ICD-10-CM | POA: Diagnosis not present

## 2014-12-13 DIAGNOSIS — I129 Hypertensive chronic kidney disease with stage 1 through stage 4 chronic kidney disease, or unspecified chronic kidney disease: Secondary | ICD-10-CM | POA: Diagnosis not present

## 2014-12-13 DIAGNOSIS — E039 Hypothyroidism, unspecified: Secondary | ICD-10-CM | POA: Diagnosis not present

## 2014-12-13 DIAGNOSIS — E7800 Pure hypercholesterolemia, unspecified: Secondary | ICD-10-CM | POA: Diagnosis not present

## 2014-12-13 DIAGNOSIS — I480 Paroxysmal atrial fibrillation: Secondary | ICD-10-CM | POA: Diagnosis not present

## 2014-12-13 DIAGNOSIS — M8580 Other specified disorders of bone density and structure, unspecified site: Secondary | ICD-10-CM | POA: Diagnosis not present

## 2014-12-13 DIAGNOSIS — Z0000 Encounter for general adult medical examination without abnormal findings: Secondary | ICD-10-CM | POA: Diagnosis not present

## 2014-12-24 DIAGNOSIS — M8589 Other specified disorders of bone density and structure, multiple sites: Secondary | ICD-10-CM | POA: Diagnosis not present

## 2014-12-24 DIAGNOSIS — M859 Disorder of bone density and structure, unspecified: Secondary | ICD-10-CM | POA: Diagnosis not present

## 2015-01-10 DIAGNOSIS — Z1231 Encounter for screening mammogram for malignant neoplasm of breast: Secondary | ICD-10-CM | POA: Diagnosis not present

## 2015-01-10 DIAGNOSIS — M1990 Unspecified osteoarthritis, unspecified site: Secondary | ICD-10-CM | POA: Diagnosis not present

## 2015-01-10 DIAGNOSIS — E7800 Pure hypercholesterolemia, unspecified: Secondary | ICD-10-CM | POA: Diagnosis not present

## 2015-01-10 DIAGNOSIS — I129 Hypertensive chronic kidney disease with stage 1 through stage 4 chronic kidney disease, or unspecified chronic kidney disease: Secondary | ICD-10-CM | POA: Diagnosis not present

## 2015-01-10 DIAGNOSIS — N184 Chronic kidney disease, stage 4 (severe): Secondary | ICD-10-CM | POA: Diagnosis not present

## 2015-01-10 DIAGNOSIS — E1129 Type 2 diabetes mellitus with other diabetic kidney complication: Secondary | ICD-10-CM | POA: Diagnosis not present

## 2015-03-06 DIAGNOSIS — N390 Urinary tract infection, site not specified: Secondary | ICD-10-CM | POA: Diagnosis not present

## 2015-04-15 DIAGNOSIS — G4485 Primary stabbing headache: Secondary | ICD-10-CM | POA: Diagnosis not present

## 2015-04-16 ENCOUNTER — Other Ambulatory Visit: Payer: Self-pay | Admitting: Family Medicine

## 2015-04-16 DIAGNOSIS — G4485 Primary stabbing headache: Secondary | ICD-10-CM

## 2015-04-23 ENCOUNTER — Ambulatory Visit
Admission: RE | Admit: 2015-04-23 | Discharge: 2015-04-23 | Disposition: A | Discharge status: Home / Self Care | Payer: Commercial Managed Care - HMO | Source: Ambulatory Visit | Attending: Family Medicine | Admitting: Family Medicine

## 2015-04-23 ENCOUNTER — Other Ambulatory Visit: Payer: Self-pay | Admitting: Family Medicine

## 2015-04-23 DIAGNOSIS — R51 Headache: Secondary | ICD-10-CM | POA: Diagnosis not present

## 2015-04-23 DIAGNOSIS — G4485 Primary stabbing headache: Secondary | ICD-10-CM

## 2015-04-29 DIAGNOSIS — N184 Chronic kidney disease, stage 4 (severe): Secondary | ICD-10-CM | POA: Diagnosis not present

## 2015-04-29 DIAGNOSIS — E7800 Pure hypercholesterolemia, unspecified: Secondary | ICD-10-CM | POA: Diagnosis not present

## 2015-04-29 DIAGNOSIS — E1129 Type 2 diabetes mellitus with other diabetic kidney complication: Secondary | ICD-10-CM | POA: Diagnosis not present

## 2015-04-29 DIAGNOSIS — M1990 Unspecified osteoarthritis, unspecified site: Secondary | ICD-10-CM | POA: Diagnosis not present

## 2015-04-29 DIAGNOSIS — I129 Hypertensive chronic kidney disease with stage 1 through stage 4 chronic kidney disease, or unspecified chronic kidney disease: Secondary | ICD-10-CM | POA: Diagnosis not present

## 2015-04-30 DIAGNOSIS — I700 Atherosclerosis of aorta: Secondary | ICD-10-CM | POA: Diagnosis not present

## 2015-04-30 DIAGNOSIS — I2510 Atherosclerotic heart disease of native coronary artery without angina pectoris: Secondary | ICD-10-CM | POA: Diagnosis not present

## 2015-04-30 DIAGNOSIS — E7800 Pure hypercholesterolemia, unspecified: Secondary | ICD-10-CM | POA: Diagnosis not present

## 2015-04-30 DIAGNOSIS — I480 Paroxysmal atrial fibrillation: Secondary | ICD-10-CM | POA: Diagnosis not present

## 2015-04-30 DIAGNOSIS — E039 Hypothyroidism, unspecified: Secondary | ICD-10-CM | POA: Diagnosis not present

## 2015-06-18 DIAGNOSIS — E7800 Pure hypercholesterolemia, unspecified: Secondary | ICD-10-CM | POA: Diagnosis not present

## 2015-06-18 DIAGNOSIS — I129 Hypertensive chronic kidney disease with stage 1 through stage 4 chronic kidney disease, or unspecified chronic kidney disease: Secondary | ICD-10-CM | POA: Diagnosis not present

## 2015-06-18 DIAGNOSIS — E1122 Type 2 diabetes mellitus with diabetic chronic kidney disease: Secondary | ICD-10-CM | POA: Diagnosis not present

## 2015-06-18 DIAGNOSIS — N183 Chronic kidney disease, stage 3 (moderate): Secondary | ICD-10-CM | POA: Diagnosis not present

## 2015-06-18 DIAGNOSIS — M8580 Other specified disorders of bone density and structure, unspecified site: Secondary | ICD-10-CM | POA: Diagnosis not present

## 2015-06-18 DIAGNOSIS — D692 Other nonthrombocytopenic purpura: Secondary | ICD-10-CM | POA: Diagnosis not present

## 2015-06-18 DIAGNOSIS — D509 Iron deficiency anemia, unspecified: Secondary | ICD-10-CM | POA: Diagnosis not present

## 2015-06-18 DIAGNOSIS — L719 Rosacea, unspecified: Secondary | ICD-10-CM | POA: Diagnosis not present

## 2015-07-07 DIAGNOSIS — N390 Urinary tract infection, site not specified: Secondary | ICD-10-CM | POA: Diagnosis not present

## 2015-07-19 DIAGNOSIS — I129 Hypertensive chronic kidney disease with stage 1 through stage 4 chronic kidney disease, or unspecified chronic kidney disease: Secondary | ICD-10-CM | POA: Diagnosis not present

## 2015-07-19 DIAGNOSIS — M1990 Unspecified osteoarthritis, unspecified site: Secondary | ICD-10-CM | POA: Diagnosis not present

## 2015-07-19 DIAGNOSIS — E7800 Pure hypercholesterolemia, unspecified: Secondary | ICD-10-CM | POA: Diagnosis not present

## 2015-07-19 DIAGNOSIS — E1129 Type 2 diabetes mellitus with other diabetic kidney complication: Secondary | ICD-10-CM | POA: Diagnosis not present

## 2015-07-19 DIAGNOSIS — N184 Chronic kidney disease, stage 4 (severe): Secondary | ICD-10-CM | POA: Diagnosis not present

## 2015-10-21 DIAGNOSIS — I480 Paroxysmal atrial fibrillation: Secondary | ICD-10-CM | POA: Diagnosis not present

## 2015-10-21 DIAGNOSIS — I2510 Atherosclerotic heart disease of native coronary artery without angina pectoris: Secondary | ICD-10-CM | POA: Diagnosis not present

## 2015-10-21 DIAGNOSIS — I350 Nonrheumatic aortic (valve) stenosis: Secondary | ICD-10-CM | POA: Diagnosis not present

## 2015-11-11 DIAGNOSIS — E039 Hypothyroidism, unspecified: Secondary | ICD-10-CM | POA: Diagnosis not present

## 2015-11-11 DIAGNOSIS — I480 Paroxysmal atrial fibrillation: Secondary | ICD-10-CM | POA: Diagnosis not present

## 2015-11-11 DIAGNOSIS — E7800 Pure hypercholesterolemia, unspecified: Secondary | ICD-10-CM | POA: Diagnosis not present

## 2015-11-11 DIAGNOSIS — I2510 Atherosclerotic heart disease of native coronary artery without angina pectoris: Secondary | ICD-10-CM | POA: Diagnosis not present

## 2015-12-12 DIAGNOSIS — M778 Other enthesopathies, not elsewhere classified: Secondary | ICD-10-CM | POA: Diagnosis not present

## 2015-12-24 DIAGNOSIS — H524 Presbyopia: Secondary | ICD-10-CM | POA: Diagnosis not present

## 2015-12-24 DIAGNOSIS — E119 Type 2 diabetes mellitus without complications: Secondary | ICD-10-CM | POA: Diagnosis not present

## 2015-12-24 DIAGNOSIS — H04123 Dry eye syndrome of bilateral lacrimal glands: Secondary | ICD-10-CM | POA: Diagnosis not present

## 2015-12-24 DIAGNOSIS — H401132 Primary open-angle glaucoma, bilateral, moderate stage: Secondary | ICD-10-CM | POA: Diagnosis not present

## 2016-02-10 DIAGNOSIS — E7800 Pure hypercholesterolemia, unspecified: Secondary | ICD-10-CM | POA: Diagnosis not present

## 2016-02-10 DIAGNOSIS — Z23 Encounter for immunization: Secondary | ICD-10-CM | POA: Diagnosis not present

## 2016-02-10 DIAGNOSIS — M8580 Other specified disorders of bone density and structure, unspecified site: Secondary | ICD-10-CM | POA: Diagnosis not present

## 2016-02-10 DIAGNOSIS — I129 Hypertensive chronic kidney disease with stage 1 through stage 4 chronic kidney disease, or unspecified chronic kidney disease: Secondary | ICD-10-CM | POA: Diagnosis not present

## 2016-02-10 DIAGNOSIS — N390 Urinary tract infection, site not specified: Secondary | ICD-10-CM | POA: Diagnosis not present

## 2016-02-10 DIAGNOSIS — D692 Other nonthrombocytopenic purpura: Secondary | ICD-10-CM | POA: Diagnosis not present

## 2016-02-10 DIAGNOSIS — L719 Rosacea, unspecified: Secondary | ICD-10-CM | POA: Diagnosis not present

## 2016-02-10 DIAGNOSIS — D509 Iron deficiency anemia, unspecified: Secondary | ICD-10-CM | POA: Diagnosis not present

## 2016-02-10 DIAGNOSIS — E1122 Type 2 diabetes mellitus with diabetic chronic kidney disease: Secondary | ICD-10-CM | POA: Diagnosis not present

## 2016-02-10 DIAGNOSIS — N183 Chronic kidney disease, stage 3 (moderate): Secondary | ICD-10-CM | POA: Diagnosis not present

## 2016-02-10 DIAGNOSIS — Z0000 Encounter for general adult medical examination without abnormal findings: Secondary | ICD-10-CM | POA: Diagnosis not present

## 2016-02-12 DIAGNOSIS — Z1231 Encounter for screening mammogram for malignant neoplasm of breast: Secondary | ICD-10-CM | POA: Diagnosis not present

## 2016-02-27 DIAGNOSIS — I129 Hypertensive chronic kidney disease with stage 1 through stage 4 chronic kidney disease, or unspecified chronic kidney disease: Secondary | ICD-10-CM | POA: Diagnosis not present

## 2016-02-27 DIAGNOSIS — I4891 Unspecified atrial fibrillation: Secondary | ICD-10-CM | POA: Diagnosis not present

## 2016-02-27 DIAGNOSIS — E1129 Type 2 diabetes mellitus with other diabetic kidney complication: Secondary | ICD-10-CM | POA: Diagnosis not present

## 2016-02-27 DIAGNOSIS — M1990 Unspecified osteoarthritis, unspecified site: Secondary | ICD-10-CM | POA: Diagnosis not present

## 2016-02-27 DIAGNOSIS — N184 Chronic kidney disease, stage 4 (severe): Secondary | ICD-10-CM | POA: Diagnosis not present

## 2016-02-27 DIAGNOSIS — E7800 Pure hypercholesterolemia, unspecified: Secondary | ICD-10-CM | POA: Diagnosis not present

## 2016-02-27 DIAGNOSIS — I350 Nonrheumatic aortic (valve) stenosis: Secondary | ICD-10-CM | POA: Diagnosis not present

## 2016-02-27 DIAGNOSIS — Z6834 Body mass index (BMI) 34.0-34.9, adult: Secondary | ICD-10-CM | POA: Diagnosis not present

## 2016-04-16 DIAGNOSIS — W109XXA Fall (on) (from) unspecified stairs and steps, initial encounter: Secondary | ICD-10-CM | POA: Diagnosis not present

## 2016-04-16 DIAGNOSIS — I469 Cardiac arrest, cause unspecified: Secondary | ICD-10-CM | POA: Diagnosis not present

## 2016-04-16 DIAGNOSIS — R0789 Other chest pain: Secondary | ICD-10-CM | POA: Diagnosis not present

## 2016-04-16 DIAGNOSIS — R001 Bradycardia, unspecified: Secondary | ICD-10-CM | POA: Diagnosis not present

## 2016-04-16 DIAGNOSIS — I130 Hypertensive heart and chronic kidney disease with heart failure and stage 1 through stage 4 chronic kidney disease, or unspecified chronic kidney disease: Secondary | ICD-10-CM | POA: Diagnosis not present

## 2016-04-16 DIAGNOSIS — I10 Essential (primary) hypertension: Secondary | ICD-10-CM | POA: Diagnosis not present

## 2016-04-16 DIAGNOSIS — J690 Pneumonitis due to inhalation of food and vomit: Secondary | ICD-10-CM | POA: Diagnosis not present

## 2016-04-16 DIAGNOSIS — I252 Old myocardial infarction: Secondary | ICD-10-CM | POA: Diagnosis not present

## 2016-04-16 DIAGNOSIS — S42221A 2-part displaced fracture of surgical neck of right humerus, initial encounter for closed fracture: Secondary | ICD-10-CM | POA: Diagnosis not present

## 2016-04-16 DIAGNOSIS — I480 Paroxysmal atrial fibrillation: Secondary | ICD-10-CM | POA: Diagnosis not present

## 2016-04-16 DIAGNOSIS — J181 Lobar pneumonia, unspecified organism: Secondary | ICD-10-CM | POA: Diagnosis not present

## 2016-04-16 DIAGNOSIS — S42301D Unspecified fracture of shaft of humerus, right arm, subsequent encounter for fracture with routine healing: Secondary | ICD-10-CM | POA: Diagnosis not present

## 2016-04-16 DIAGNOSIS — S42291A Other displaced fracture of upper end of right humerus, initial encounter for closed fracture: Secondary | ICD-10-CM | POA: Diagnosis not present

## 2016-04-16 DIAGNOSIS — E7800 Pure hypercholesterolemia, unspecified: Secondary | ICD-10-CM | POA: Diagnosis not present

## 2016-04-16 DIAGNOSIS — D649 Anemia, unspecified: Secondary | ICD-10-CM | POA: Diagnosis not present

## 2016-04-16 DIAGNOSIS — R0600 Dyspnea, unspecified: Secondary | ICD-10-CM | POA: Diagnosis not present

## 2016-04-16 DIAGNOSIS — E869 Volume depletion, unspecified: Secondary | ICD-10-CM | POA: Diagnosis not present

## 2016-04-16 DIAGNOSIS — R0902 Hypoxemia: Secondary | ICD-10-CM | POA: Diagnosis not present

## 2016-04-16 DIAGNOSIS — S42211A Unspecified displaced fracture of surgical neck of right humerus, initial encounter for closed fracture: Secondary | ICD-10-CM | POA: Diagnosis not present

## 2016-04-16 DIAGNOSIS — T148XXA Other injury of unspecified body region, initial encounter: Secondary | ICD-10-CM | POA: Diagnosis not present

## 2016-04-16 DIAGNOSIS — W108XXA Fall (on) (from) other stairs and steps, initial encounter: Secondary | ICD-10-CM | POA: Diagnosis not present

## 2016-04-16 DIAGNOSIS — R443 Hallucinations, unspecified: Secondary | ICD-10-CM | POA: Diagnosis not present

## 2016-04-16 DIAGNOSIS — I509 Heart failure, unspecified: Secondary | ICD-10-CM | POA: Diagnosis not present

## 2016-04-16 DIAGNOSIS — D638 Anemia in other chronic diseases classified elsewhere: Secondary | ICD-10-CM | POA: Diagnosis not present

## 2016-04-16 DIAGNOSIS — R600 Localized edema: Secondary | ICD-10-CM | POA: Diagnosis not present

## 2016-04-16 DIAGNOSIS — J189 Pneumonia, unspecified organism: Secondary | ICD-10-CM | POA: Diagnosis not present

## 2016-04-16 DIAGNOSIS — T50905A Adverse effect of unspecified drugs, medicaments and biological substances, initial encounter: Secondary | ICD-10-CM | POA: Diagnosis not present

## 2016-04-16 DIAGNOSIS — E876 Hypokalemia: Secondary | ICD-10-CM | POA: Diagnosis not present

## 2016-04-16 DIAGNOSIS — M25511 Pain in right shoulder: Secondary | ICD-10-CM | POA: Diagnosis not present

## 2016-04-16 DIAGNOSIS — I952 Hypotension due to drugs: Secondary | ICD-10-CM | POA: Diagnosis not present

## 2016-04-16 DIAGNOSIS — S42201A Unspecified fracture of upper end of right humerus, initial encounter for closed fracture: Secondary | ICD-10-CM | POA: Diagnosis not present

## 2016-04-16 DIAGNOSIS — J9690 Respiratory failure, unspecified, unspecified whether with hypoxia or hypercapnia: Secondary | ICD-10-CM | POA: Diagnosis not present

## 2016-04-16 DIAGNOSIS — N179 Acute kidney failure, unspecified: Secondary | ICD-10-CM | POA: Diagnosis not present

## 2016-04-16 DIAGNOSIS — R079 Chest pain, unspecified: Secondary | ICD-10-CM | POA: Diagnosis not present

## 2016-04-16 DIAGNOSIS — I129 Hypertensive chronic kidney disease with stage 1 through stage 4 chronic kidney disease, or unspecified chronic kidney disease: Secondary | ICD-10-CM | POA: Diagnosis not present

## 2016-04-16 DIAGNOSIS — N189 Chronic kidney disease, unspecified: Secondary | ICD-10-CM | POA: Diagnosis not present

## 2016-04-16 DIAGNOSIS — J9601 Acute respiratory failure with hypoxia: Secondary | ICD-10-CM | POA: Diagnosis not present

## 2016-04-16 DIAGNOSIS — E872 Acidosis: Secondary | ICD-10-CM | POA: Diagnosis not present

## 2016-04-16 DIAGNOSIS — N390 Urinary tract infection, site not specified: Secondary | ICD-10-CM | POA: Diagnosis not present

## 2016-04-16 DIAGNOSIS — E875 Hyperkalemia: Secondary | ICD-10-CM | POA: Diagnosis not present

## 2016-04-16 DIAGNOSIS — D631 Anemia in chronic kidney disease: Secondary | ICD-10-CM | POA: Diagnosis not present

## 2016-04-16 DIAGNOSIS — R9431 Abnormal electrocardiogram [ECG] [EKG]: Secondary | ICD-10-CM | POA: Diagnosis not present

## 2016-04-16 DIAGNOSIS — R7989 Other specified abnormal findings of blood chemistry: Secondary | ICD-10-CM | POA: Diagnosis not present

## 2016-04-16 DIAGNOSIS — B9620 Unspecified Escherichia coli [E. coli] as the cause of diseases classified elsewhere: Secondary | ICD-10-CM | POA: Diagnosis not present

## 2016-04-16 DIAGNOSIS — R918 Other nonspecific abnormal finding of lung field: Secondary | ICD-10-CM | POA: Diagnosis not present

## 2016-04-16 DIAGNOSIS — R509 Fever, unspecified: Secondary | ICD-10-CM | POA: Diagnosis not present

## 2016-04-16 DIAGNOSIS — I517 Cardiomegaly: Secondary | ICD-10-CM | POA: Diagnosis not present

## 2016-04-16 DIAGNOSIS — Z8679 Personal history of other diseases of the circulatory system: Secondary | ICD-10-CM | POA: Diagnosis not present

## 2016-04-30 DIAGNOSIS — N189 Chronic kidney disease, unspecified: Secondary | ICD-10-CM | POA: Diagnosis not present

## 2016-04-30 DIAGNOSIS — E139 Other specified diabetes mellitus without complications: Secondary | ICD-10-CM | POA: Diagnosis not present

## 2016-04-30 DIAGNOSIS — I129 Hypertensive chronic kidney disease with stage 1 through stage 4 chronic kidney disease, or unspecified chronic kidney disease: Secondary | ICD-10-CM | POA: Diagnosis not present

## 2016-04-30 DIAGNOSIS — E876 Hypokalemia: Secondary | ICD-10-CM | POA: Diagnosis not present

## 2016-04-30 DIAGNOSIS — I952 Hypotension due to drugs: Secondary | ICD-10-CM | POA: Diagnosis not present

## 2016-04-30 DIAGNOSIS — D631 Anemia in chronic kidney disease: Secondary | ICD-10-CM | POA: Diagnosis not present

## 2016-04-30 DIAGNOSIS — E7800 Pure hypercholesterolemia, unspecified: Secondary | ICD-10-CM | POA: Diagnosis not present

## 2016-04-30 DIAGNOSIS — R0789 Other chest pain: Secondary | ICD-10-CM | POA: Diagnosis not present

## 2016-04-30 DIAGNOSIS — J189 Pneumonia, unspecified organism: Secondary | ICD-10-CM | POA: Diagnosis not present

## 2016-04-30 DIAGNOSIS — N179 Acute kidney failure, unspecified: Secondary | ICD-10-CM | POA: Diagnosis not present

## 2016-04-30 DIAGNOSIS — R079 Chest pain, unspecified: Secondary | ICD-10-CM | POA: Diagnosis not present

## 2016-04-30 DIAGNOSIS — Z8719 Personal history of other diseases of the digestive system: Secondary | ICD-10-CM | POA: Diagnosis not present

## 2016-04-30 DIAGNOSIS — I480 Paroxysmal atrial fibrillation: Secondary | ICD-10-CM | POA: Diagnosis not present

## 2016-04-30 DIAGNOSIS — D638 Anemia in other chronic diseases classified elsewhere: Secondary | ICD-10-CM | POA: Diagnosis not present

## 2016-04-30 DIAGNOSIS — E869 Volume depletion, unspecified: Secondary | ICD-10-CM | POA: Diagnosis not present

## 2016-04-30 DIAGNOSIS — R7989 Other specified abnormal findings of blood chemistry: Secondary | ICD-10-CM | POA: Diagnosis not present

## 2016-04-30 DIAGNOSIS — I469 Cardiac arrest, cause unspecified: Secondary | ICD-10-CM | POA: Diagnosis not present

## 2016-04-30 DIAGNOSIS — S42201A Unspecified fracture of upper end of right humerus, initial encounter for closed fracture: Secondary | ICD-10-CM | POA: Diagnosis not present

## 2016-04-30 DIAGNOSIS — I350 Nonrheumatic aortic (valve) stenosis: Secondary | ICD-10-CM | POA: Diagnosis not present

## 2016-04-30 DIAGNOSIS — E039 Hypothyroidism, unspecified: Secondary | ICD-10-CM | POA: Diagnosis not present

## 2016-04-30 DIAGNOSIS — S42211A Unspecified displaced fracture of surgical neck of right humerus, initial encounter for closed fracture: Secondary | ICD-10-CM | POA: Diagnosis not present

## 2016-04-30 DIAGNOSIS — I2510 Atherosclerotic heart disease of native coronary artery without angina pectoris: Secondary | ICD-10-CM | POA: Diagnosis not present

## 2016-04-30 DIAGNOSIS — S42024A Nondisplaced fracture of shaft of right clavicle, initial encounter for closed fracture: Secondary | ICD-10-CM | POA: Diagnosis not present

## 2016-04-30 DIAGNOSIS — J9601 Acute respiratory failure with hypoxia: Secondary | ICD-10-CM | POA: Diagnosis not present

## 2016-04-30 DIAGNOSIS — N390 Urinary tract infection, site not specified: Secondary | ICD-10-CM | POA: Diagnosis not present

## 2016-05-03 DIAGNOSIS — S42201A Unspecified fracture of upper end of right humerus, initial encounter for closed fracture: Secondary | ICD-10-CM | POA: Diagnosis not present

## 2016-05-03 DIAGNOSIS — S42024A Nondisplaced fracture of shaft of right clavicle, initial encounter for closed fracture: Secondary | ICD-10-CM | POA: Diagnosis not present

## 2016-05-13 DIAGNOSIS — J9601 Acute respiratory failure with hypoxia: Secondary | ICD-10-CM | POA: Diagnosis not present

## 2016-05-13 DIAGNOSIS — D631 Anemia in chronic kidney disease: Secondary | ICD-10-CM | POA: Diagnosis not present

## 2016-05-13 DIAGNOSIS — S42301B Unspecified fracture of shaft of humerus, right arm, initial encounter for open fracture: Secondary | ICD-10-CM | POA: Diagnosis not present

## 2016-05-13 DIAGNOSIS — I129 Hypertensive chronic kidney disease with stage 1 through stage 4 chronic kidney disease, or unspecified chronic kidney disease: Secondary | ICD-10-CM | POA: Diagnosis not present

## 2016-05-13 DIAGNOSIS — R0602 Shortness of breath: Secondary | ICD-10-CM | POA: Diagnosis not present

## 2016-05-13 DIAGNOSIS — S42201A Unspecified fracture of upper end of right humerus, initial encounter for closed fracture: Secondary | ICD-10-CM | POA: Diagnosis not present

## 2016-05-13 DIAGNOSIS — E1122 Type 2 diabetes mellitus with diabetic chronic kidney disease: Secondary | ICD-10-CM | POA: Diagnosis not present

## 2016-05-13 DIAGNOSIS — S42231A 3-part fracture of surgical neck of right humerus, initial encounter for closed fracture: Secondary | ICD-10-CM | POA: Diagnosis not present

## 2016-05-13 DIAGNOSIS — N184 Chronic kidney disease, stage 4 (severe): Secondary | ICD-10-CM | POA: Diagnosis not present

## 2016-05-13 DIAGNOSIS — E039 Hypothyroidism, unspecified: Secondary | ICD-10-CM | POA: Diagnosis not present

## 2016-05-13 DIAGNOSIS — I480 Paroxysmal atrial fibrillation: Secondary | ICD-10-CM | POA: Diagnosis not present

## 2016-05-14 DIAGNOSIS — I480 Paroxysmal atrial fibrillation: Secondary | ICD-10-CM | POA: Diagnosis not present

## 2016-05-14 DIAGNOSIS — I959 Hypotension, unspecified: Secondary | ICD-10-CM | POA: Diagnosis not present

## 2016-05-14 DIAGNOSIS — D631 Anemia in chronic kidney disease: Secondary | ICD-10-CM | POA: Diagnosis not present

## 2016-05-14 DIAGNOSIS — S42301B Unspecified fracture of shaft of humerus, right arm, initial encounter for open fracture: Secondary | ICD-10-CM | POA: Diagnosis not present

## 2016-05-14 DIAGNOSIS — R0902 Hypoxemia: Secondary | ICD-10-CM | POA: Diagnosis not present

## 2016-05-14 DIAGNOSIS — J9601 Acute respiratory failure with hypoxia: Secondary | ICD-10-CM | POA: Diagnosis not present

## 2016-05-14 DIAGNOSIS — N184 Chronic kidney disease, stage 4 (severe): Secondary | ICD-10-CM | POA: Diagnosis not present

## 2016-05-14 DIAGNOSIS — R0602 Shortness of breath: Secondary | ICD-10-CM | POA: Diagnosis not present

## 2016-05-14 DIAGNOSIS — R079 Chest pain, unspecified: Secondary | ICD-10-CM | POA: Diagnosis not present

## 2016-05-14 DIAGNOSIS — N183 Chronic kidney disease, stage 3 (moderate): Secondary | ICD-10-CM | POA: Diagnosis not present

## 2016-05-14 DIAGNOSIS — E039 Hypothyroidism, unspecified: Secondary | ICD-10-CM | POA: Diagnosis not present

## 2016-05-14 DIAGNOSIS — E1122 Type 2 diabetes mellitus with diabetic chronic kidney disease: Secondary | ICD-10-CM | POA: Diagnosis not present

## 2016-05-14 DIAGNOSIS — I129 Hypertensive chronic kidney disease with stage 1 through stage 4 chronic kidney disease, or unspecified chronic kidney disease: Secondary | ICD-10-CM | POA: Diagnosis not present

## 2016-05-15 DIAGNOSIS — R0602 Shortness of breath: Secondary | ICD-10-CM | POA: Diagnosis not present

## 2016-05-15 DIAGNOSIS — N184 Chronic kidney disease, stage 4 (severe): Secondary | ICD-10-CM | POA: Diagnosis not present

## 2016-05-15 DIAGNOSIS — N183 Chronic kidney disease, stage 3 (moderate): Secondary | ICD-10-CM | POA: Diagnosis not present

## 2016-05-15 DIAGNOSIS — I480 Paroxysmal atrial fibrillation: Secondary | ICD-10-CM | POA: Diagnosis not present

## 2016-05-15 DIAGNOSIS — I129 Hypertensive chronic kidney disease with stage 1 through stage 4 chronic kidney disease, or unspecified chronic kidney disease: Secondary | ICD-10-CM | POA: Diagnosis not present

## 2016-05-15 DIAGNOSIS — E1122 Type 2 diabetes mellitus with diabetic chronic kidney disease: Secondary | ICD-10-CM | POA: Diagnosis not present

## 2016-05-15 DIAGNOSIS — D62 Acute posthemorrhagic anemia: Secondary | ICD-10-CM | POA: Diagnosis not present

## 2016-05-15 DIAGNOSIS — S42301B Unspecified fracture of shaft of humerus, right arm, initial encounter for open fracture: Secondary | ICD-10-CM | POA: Diagnosis not present

## 2016-05-15 DIAGNOSIS — I959 Hypotension, unspecified: Secondary | ICD-10-CM | POA: Diagnosis not present

## 2016-05-15 DIAGNOSIS — R0902 Hypoxemia: Secondary | ICD-10-CM | POA: Diagnosis not present

## 2016-05-15 DIAGNOSIS — J9601 Acute respiratory failure with hypoxia: Secondary | ICD-10-CM | POA: Diagnosis not present

## 2016-05-15 DIAGNOSIS — E039 Hypothyroidism, unspecified: Secondary | ICD-10-CM | POA: Diagnosis not present

## 2016-05-15 DIAGNOSIS — D631 Anemia in chronic kidney disease: Secondary | ICD-10-CM | POA: Diagnosis not present

## 2016-05-21 DIAGNOSIS — R0602 Shortness of breath: Secondary | ICD-10-CM | POA: Diagnosis not present

## 2016-05-21 DIAGNOSIS — I129 Hypertensive chronic kidney disease with stage 1 through stage 4 chronic kidney disease, or unspecified chronic kidney disease: Secondary | ICD-10-CM | POA: Diagnosis not present

## 2016-05-21 DIAGNOSIS — Z09 Encounter for follow-up examination after completed treatment for conditions other than malignant neoplasm: Secondary | ICD-10-CM | POA: Diagnosis not present

## 2016-05-21 DIAGNOSIS — S42309A Unspecified fracture of shaft of humerus, unspecified arm, initial encounter for closed fracture: Secondary | ICD-10-CM | POA: Diagnosis not present

## 2016-05-28 DIAGNOSIS — Z4789 Encounter for other orthopedic aftercare: Secondary | ICD-10-CM | POA: Diagnosis not present

## 2016-06-06 DIAGNOSIS — N390 Urinary tract infection, site not specified: Secondary | ICD-10-CM | POA: Diagnosis not present

## 2016-06-07 DIAGNOSIS — E1122 Type 2 diabetes mellitus with diabetic chronic kidney disease: Secondary | ICD-10-CM | POA: Diagnosis not present

## 2016-06-07 DIAGNOSIS — E119 Type 2 diabetes mellitus without complications: Secondary | ICD-10-CM | POA: Diagnosis not present

## 2016-06-07 DIAGNOSIS — N289 Disorder of kidney and ureter, unspecified: Secondary | ICD-10-CM | POA: Diagnosis not present

## 2016-06-07 DIAGNOSIS — R000 Tachycardia, unspecified: Secondary | ICD-10-CM | POA: Diagnosis not present

## 2016-06-07 DIAGNOSIS — I2510 Atherosclerotic heart disease of native coronary artery without angina pectoris: Secondary | ICD-10-CM | POA: Diagnosis not present

## 2016-06-07 DIAGNOSIS — I213 ST elevation (STEMI) myocardial infarction of unspecified site: Secondary | ICD-10-CM | POA: Diagnosis not present

## 2016-06-07 DIAGNOSIS — R0789 Other chest pain: Secondary | ICD-10-CM | POA: Diagnosis not present

## 2016-06-07 DIAGNOSIS — N183 Chronic kidney disease, stage 3 (moderate): Secondary | ICD-10-CM | POA: Diagnosis not present

## 2016-06-07 DIAGNOSIS — N189 Chronic kidney disease, unspecified: Secondary | ICD-10-CM | POA: Diagnosis not present

## 2016-06-07 DIAGNOSIS — I517 Cardiomegaly: Secondary | ICD-10-CM | POA: Diagnosis not present

## 2016-06-07 DIAGNOSIS — R0602 Shortness of breath: Secondary | ICD-10-CM | POA: Diagnosis not present

## 2016-06-07 DIAGNOSIS — I248 Other forms of acute ischemic heart disease: Secondary | ICD-10-CM | POA: Diagnosis not present

## 2016-06-07 DIAGNOSIS — Z7982 Long term (current) use of aspirin: Secondary | ICD-10-CM | POA: Diagnosis not present

## 2016-06-07 DIAGNOSIS — I082 Rheumatic disorders of both aortic and tricuspid valves: Secondary | ICD-10-CM | POA: Diagnosis not present

## 2016-06-07 DIAGNOSIS — R748 Abnormal levels of other serum enzymes: Secondary | ICD-10-CM | POA: Diagnosis not present

## 2016-06-07 DIAGNOSIS — N390 Urinary tract infection, site not specified: Secondary | ICD-10-CM | POA: Diagnosis not present

## 2016-06-07 DIAGNOSIS — I25118 Atherosclerotic heart disease of native coronary artery with other forms of angina pectoris: Secondary | ICD-10-CM | POA: Diagnosis not present

## 2016-06-07 DIAGNOSIS — Z8249 Family history of ischemic heart disease and other diseases of the circulatory system: Secondary | ICD-10-CM | POA: Diagnosis not present

## 2016-06-07 DIAGNOSIS — R079 Chest pain, unspecified: Secondary | ICD-10-CM | POA: Diagnosis not present

## 2016-06-07 DIAGNOSIS — I1310 Hypertensive heart and chronic kidney disease without heart failure, with stage 1 through stage 4 chronic kidney disease, or unspecified chronic kidney disease: Secondary | ICD-10-CM | POA: Diagnosis not present

## 2016-06-07 DIAGNOSIS — I480 Paroxysmal atrial fibrillation: Secondary | ICD-10-CM | POA: Diagnosis not present

## 2016-06-07 DIAGNOSIS — I471 Supraventricular tachycardia: Secondary | ICD-10-CM | POA: Diagnosis not present

## 2016-06-07 DIAGNOSIS — E039 Hypothyroidism, unspecified: Secondary | ICD-10-CM | POA: Diagnosis not present

## 2016-06-17 DIAGNOSIS — I471 Supraventricular tachycardia: Secondary | ICD-10-CM | POA: Diagnosis not present

## 2016-06-17 DIAGNOSIS — R300 Dysuria: Secondary | ICD-10-CM | POA: Diagnosis not present

## 2016-06-17 DIAGNOSIS — I480 Paroxysmal atrial fibrillation: Secondary | ICD-10-CM | POA: Diagnosis not present

## 2016-06-17 DIAGNOSIS — N183 Chronic kidney disease, stage 3 (moderate): Secondary | ICD-10-CM | POA: Diagnosis not present

## 2016-06-17 DIAGNOSIS — I129 Hypertensive chronic kidney disease with stage 1 through stage 4 chronic kidney disease, or unspecified chronic kidney disease: Secondary | ICD-10-CM | POA: Diagnosis not present

## 2016-06-17 DIAGNOSIS — I2510 Atherosclerotic heart disease of native coronary artery without angina pectoris: Secondary | ICD-10-CM | POA: Diagnosis not present

## 2016-06-22 DIAGNOSIS — N183 Chronic kidney disease, stage 3 (moderate): Secondary | ICD-10-CM | POA: Diagnosis not present

## 2016-06-22 DIAGNOSIS — I952 Hypotension due to drugs: Secondary | ICD-10-CM | POA: Diagnosis not present

## 2016-06-22 DIAGNOSIS — Z8719 Personal history of other diseases of the digestive system: Secondary | ICD-10-CM | POA: Diagnosis not present

## 2016-06-22 DIAGNOSIS — I469 Cardiac arrest, cause unspecified: Secondary | ICD-10-CM | POA: Diagnosis not present

## 2016-06-22 DIAGNOSIS — Z66 Do not resuscitate: Secondary | ICD-10-CM | POA: Diagnosis not present

## 2016-06-22 DIAGNOSIS — Z9889 Other specified postprocedural states: Secondary | ICD-10-CM | POA: Diagnosis not present

## 2016-06-22 DIAGNOSIS — I4581 Long QT syndrome: Secondary | ICD-10-CM | POA: Diagnosis not present

## 2016-06-22 DIAGNOSIS — N390 Urinary tract infection, site not specified: Secondary | ICD-10-CM | POA: Diagnosis not present

## 2016-06-22 DIAGNOSIS — I480 Paroxysmal atrial fibrillation: Secondary | ICD-10-CM | POA: Diagnosis not present

## 2016-06-23 DIAGNOSIS — Z4789 Encounter for other orthopedic aftercare: Secondary | ICD-10-CM | POA: Diagnosis not present

## 2016-07-01 DIAGNOSIS — M25511 Pain in right shoulder: Secondary | ICD-10-CM | POA: Diagnosis not present

## 2016-07-01 DIAGNOSIS — Z4789 Encounter for other orthopedic aftercare: Secondary | ICD-10-CM | POA: Diagnosis not present

## 2016-07-01 DIAGNOSIS — Z7409 Other reduced mobility: Secondary | ICD-10-CM | POA: Diagnosis not present

## 2016-07-05 DIAGNOSIS — R0600 Dyspnea, unspecified: Secondary | ICD-10-CM | POA: Diagnosis not present

## 2016-07-05 DIAGNOSIS — R079 Chest pain, unspecified: Secondary | ICD-10-CM | POA: Diagnosis not present

## 2016-07-05 DIAGNOSIS — T675XXA Heat exhaustion, unspecified, initial encounter: Secondary | ICD-10-CM | POA: Diagnosis not present

## 2016-07-09 DIAGNOSIS — M25611 Stiffness of right shoulder, not elsewhere classified: Secondary | ICD-10-CM | POA: Diagnosis not present

## 2016-07-09 DIAGNOSIS — S42291D Other displaced fracture of upper end of right humerus, subsequent encounter for fracture with routine healing: Secondary | ICD-10-CM | POA: Diagnosis not present

## 2016-07-17 DIAGNOSIS — S42291D Other displaced fracture of upper end of right humerus, subsequent encounter for fracture with routine healing: Secondary | ICD-10-CM | POA: Diagnosis not present

## 2016-07-17 DIAGNOSIS — M25611 Stiffness of right shoulder, not elsewhere classified: Secondary | ICD-10-CM | POA: Diagnosis not present

## 2016-07-22 DIAGNOSIS — S42291D Other displaced fracture of upper end of right humerus, subsequent encounter for fracture with routine healing: Secondary | ICD-10-CM | POA: Diagnosis not present

## 2016-07-22 DIAGNOSIS — M25611 Stiffness of right shoulder, not elsewhere classified: Secondary | ICD-10-CM | POA: Diagnosis not present

## 2016-07-24 DIAGNOSIS — Z4789 Encounter for other orthopedic aftercare: Secondary | ICD-10-CM | POA: Diagnosis not present

## 2016-07-29 DIAGNOSIS — S42291D Other displaced fracture of upper end of right humerus, subsequent encounter for fracture with routine healing: Secondary | ICD-10-CM | POA: Diagnosis not present

## 2016-07-29 DIAGNOSIS — M25611 Stiffness of right shoulder, not elsewhere classified: Secondary | ICD-10-CM | POA: Diagnosis not present

## 2016-07-31 DIAGNOSIS — R030 Elevated blood-pressure reading, without diagnosis of hypertension: Secondary | ICD-10-CM | POA: Diagnosis not present

## 2016-08-02 DIAGNOSIS — R300 Dysuria: Secondary | ICD-10-CM | POA: Diagnosis not present

## 2016-08-02 DIAGNOSIS — N390 Urinary tract infection, site not specified: Secondary | ICD-10-CM | POA: Diagnosis not present

## 2016-08-05 DIAGNOSIS — M25611 Stiffness of right shoulder, not elsewhere classified: Secondary | ICD-10-CM | POA: Diagnosis not present

## 2016-08-05 DIAGNOSIS — S42291D Other displaced fracture of upper end of right humerus, subsequent encounter for fracture with routine healing: Secondary | ICD-10-CM | POA: Diagnosis not present

## 2016-08-10 DIAGNOSIS — I129 Hypertensive chronic kidney disease with stage 1 through stage 4 chronic kidney disease, or unspecified chronic kidney disease: Secondary | ICD-10-CM | POA: Diagnosis not present

## 2016-08-10 DIAGNOSIS — D692 Other nonthrombocytopenic purpura: Secondary | ICD-10-CM | POA: Diagnosis not present

## 2016-08-10 DIAGNOSIS — D509 Iron deficiency anemia, unspecified: Secondary | ICD-10-CM | POA: Diagnosis not present

## 2016-08-10 DIAGNOSIS — N183 Chronic kidney disease, stage 3 (moderate): Secondary | ICD-10-CM | POA: Diagnosis not present

## 2016-08-10 DIAGNOSIS — E7800 Pure hypercholesterolemia, unspecified: Secondary | ICD-10-CM | POA: Diagnosis not present

## 2016-08-10 DIAGNOSIS — R350 Frequency of micturition: Secondary | ICD-10-CM | POA: Diagnosis not present

## 2016-08-10 DIAGNOSIS — E1122 Type 2 diabetes mellitus with diabetic chronic kidney disease: Secondary | ICD-10-CM | POA: Diagnosis not present

## 2016-08-10 DIAGNOSIS — R002 Palpitations: Secondary | ICD-10-CM | POA: Diagnosis not present

## 2016-08-10 DIAGNOSIS — N390 Urinary tract infection, site not specified: Secondary | ICD-10-CM | POA: Diagnosis not present

## 2016-08-10 DIAGNOSIS — Z6830 Body mass index (BMI) 30.0-30.9, adult: Secondary | ICD-10-CM | POA: Diagnosis not present

## 2016-08-12 DIAGNOSIS — S42291D Other displaced fracture of upper end of right humerus, subsequent encounter for fracture with routine healing: Secondary | ICD-10-CM | POA: Diagnosis not present

## 2016-08-19 DIAGNOSIS — M25511 Pain in right shoulder: Secondary | ICD-10-CM | POA: Diagnosis not present

## 2016-08-19 DIAGNOSIS — S42201D Unspecified fracture of upper end of right humerus, subsequent encounter for fracture with routine healing: Secondary | ICD-10-CM | POA: Diagnosis not present

## 2016-08-19 DIAGNOSIS — S42291D Other displaced fracture of upper end of right humerus, subsequent encounter for fracture with routine healing: Secondary | ICD-10-CM | POA: Diagnosis not present

## 2016-08-25 DIAGNOSIS — H04123 Dry eye syndrome of bilateral lacrimal glands: Secondary | ICD-10-CM | POA: Diagnosis not present

## 2016-08-25 DIAGNOSIS — H26492 Other secondary cataract, left eye: Secondary | ICD-10-CM | POA: Diagnosis not present

## 2016-08-25 DIAGNOSIS — H401132 Primary open-angle glaucoma, bilateral, moderate stage: Secondary | ICD-10-CM | POA: Diagnosis not present

## 2016-08-25 DIAGNOSIS — E119 Type 2 diabetes mellitus without complications: Secondary | ICD-10-CM | POA: Diagnosis not present

## 2016-08-25 DIAGNOSIS — H524 Presbyopia: Secondary | ICD-10-CM | POA: Diagnosis not present

## 2016-08-26 DIAGNOSIS — S42291D Other displaced fracture of upper end of right humerus, subsequent encounter for fracture with routine healing: Secondary | ICD-10-CM | POA: Diagnosis not present

## 2016-09-02 ENCOUNTER — Ambulatory Visit (INDEPENDENT_AMBULATORY_CARE_PROVIDER_SITE_OTHER): Payer: Medicare HMO | Admitting: Physical Therapy

## 2016-09-02 ENCOUNTER — Encounter: Payer: Self-pay | Admitting: Physical Therapy

## 2016-09-02 DIAGNOSIS — R293 Abnormal posture: Secondary | ICD-10-CM

## 2016-09-02 DIAGNOSIS — M25611 Stiffness of right shoulder, not elsewhere classified: Secondary | ICD-10-CM

## 2016-09-02 DIAGNOSIS — M25511 Pain in right shoulder: Secondary | ICD-10-CM

## 2016-09-02 DIAGNOSIS — M6281 Muscle weakness (generalized): Secondary | ICD-10-CM | POA: Diagnosis not present

## 2016-09-02 NOTE — Patient Instructions (Addendum)
Thoracic Lift - use a pillow under your head to have good alignment.     Press shoulders down. Then lift mid-thoracic spine (area between the shoulder blades). Lift the breastbone slightly. Hold _5__ seconds. Relax. Repeat _10__ times. Twice a day.    Head Press With [employer] - use a pillow under your head for good alignment     Tuck chin SLIGHTLY toward chest, keep mouth closed. Feel weight on back of head. Increase weight by pressing head down. Hold _5__ seconds. Relax. Repeat _10__ times. Twice a day.    Scapular Retraction (Standing)    With arms at sides, pinch shoulder blades together. Hold 5 sec. Repeat _10___ times per set. Do __2__ sessions per day.   Keep doing the pendulum exercises and the dowel exercises.   Trigger Point Dry Needling  . What is Trigger Point Dry Needling (DN)? o DN is a physical therapy technique used to treat muscle pain and dysfunction. Specifically, DN helps deactivate muscle trigger points (muscle knots).  o A thin filiform needle is used to penetrate the skin and stimulate the underlying trigger point. The goal is for a local twitch response (LTR) to occur and for the trigger point to relax. No medication of any kind is injected during the procedure.   . What Does Trigger Point Dry Needling Feel Like?  o The procedure feels different for each individual patient. Some patients report that they do not actually feel the needle enter the skin and overall the process is not painful. Very mild bleeding may occur. However, many patients feel a deep cramping in the muscle in which the needle was inserted. This is the local twitch response.   Marland Kitchen How Will I feel after the treatment? o Soreness is normal, and the onset of soreness may not occur for a few hours. Typically this soreness does not last longer than two days.  o Bruising is uncommon, however; ice can be used to decrease any possible bruising.  o In rare cases feeling tired or nauseous after the  treatment is normal. In addition, your symptoms may get worse before they get better, this period will typically not last longer than 24 hours.   . What Can I do After My Treatment? o Increase your hydration by drinking more water for the next 24 hours. o You may place ice or heat on the areas treated that have become sore, however, do not use heat on inflamed or bruised areas. Heat often brings more relief post needling. o You can continue your regular activities, but vigorous activity is not recommended initially after the treatment for 24 hours. o DN is best combined with other physical therapy such as strengthening, stretching, and other therapies.

## 2016-09-02 NOTE — Therapy (Signed)
[place], [place], [address] Phone: [phone]   Fax:  [phone]  Physical Therapy Evaluation  Patient Details  Name: INIS BORNEMAN MRN: [id number] Date of Birth: [birth year] Referring Provider:  Dr Corbin Ade  Encounter Date: 09/02/2016      PT End of Session - 09/02/16 1056    Visit Number 1   Number of Visits 12   Date for PT Re-Evaluation 10/14/16   PT Start Time 1056   PT Stop Time 1144   PT Time Calculation (min) 48 min   Activity Tolerance Patient tolerated treatment well      Past Medical History:  Diagnosis Date  . Atrial fibrillation ([place])   . Diabetes mellitus without complication ([place])   . Hypertension   . Thyroid disease     History reviewed. No pertinent surgical history.  There were no vitals filed for this visit.       Subjective Assessment - 09/02/16 1100    Subjective Pt fell in April, had ORIF in May for Rt humeral fx, she was in a sling until mid June.  At initial injury she went into cardiac arrest from the pain meds.  This had to be managed before she had the corrective. She has been having therapy for about 5 weeks at another facility however feels like she isn't improving the way she would like.  Using pulleys at home and at PT, UBE and ultrasound. She has signs of carpal tunnel in the Rt hand and only one finger is still bothering her.  Performing pendulums , AAROM for her shoulder.    Patient Stated Goals she wishes to reach out and up, is very limited    Currently in Pain? No/denies  at rest   Pain Orientation Right  into anterior shoulder and deltoid   Pain Type Acute pain   Aggravating Factors  has pain if she over does her stretches with the pulleys   Pain Relieving Factors heat, TENS unit at home, biofreeze, tylenol             [facility] PT Assessment - 09/02/16 0001      Assessment   Medical Diagnosis s/p ORIF proximal Rt humeral fx   Referring Provider  Dr Corbin Ade   Onset Date/Surgical Date 04/16/16  surgery 05/13/16   Hand Dominance Right   Next MD Visit PRN   Prior Therapy yes just this past month     Precautions   Precautions None     Balance Screen   Has the patient fallen in the past 6 months Yes   How many times? 2  first from tripping on rug no injury, second this fx     Prior Function   Level of Independence Independent;Needs assistance with homemaking  modification for upper body dressing,    Vocation Retired   Leisure read, work puzzles, work Architectural technologist.      Observation/Other Assessments   Focus on Therapeutic Outcomes (FOTO)  58% limited     Posture/Postural Control   Posture/Postural Control Postural limitations   Postural Limitations Rounded Shoulders;Forward head;Increased thoracic kyphosis     ROM / Strength   AROM / PROM / Strength AROM;PROM;Strength     AROM   AROM Assessment Site Shoulder;Elbow;Forearm;Wrist;Cervical   Right/Left Shoulder Right   Right Shoulder Flexion 78 Degrees   Right Shoulder ABduction 32 Degrees   Right Shoulder Internal Rotation 68 Degrees   Right Shoulder External Rotation 32 Degrees  Right/Left Elbow Right   Right Elbow Flexion --  WNL to shoulder   Right Elbow Extension -40   Right/Left Forearm --  WNL   Right/Left Wrist --  WNL   Cervical Flexion WNL   Cervical Extension 50   Cervical - Right Rotation 70   Cervical - Left Rotation 75     PROM   PROM Assessment Site Shoulder;Elbow   Right/Left Shoulder Right   Right Shoulder Flexion 112 Degrees   Right Shoulder ABduction 90 Degrees   Right Shoulder Internal Rotation 32 Degrees   Right Shoulder External Rotation 82 Degrees   Right/Left Elbow Right   Right Elbow Flexion --  to shoulder   Right Elbow Extension 0     Strength   Strength Assessment Site Shoulder;Elbow;Forearm   Right/Left Shoulder Right   Right Shoulder Flexion 3-/5   Right Shoulder ABduction 3-/5   Right Shoulder  Internal Rotation 4+/5   Right Shoulder External Rotation 3-/5   Right/Left Elbow Right   Right Elbow Flexion 4+/5   Right Elbow Extension 4-/5     Flexibility   Soft Tissue Assessment /Muscle Length --  tight pecs bilat     Palpation   Palpation comment trigger points in Rt deltoid, tightness Rt scapular muscles and neck.             Objective measurements completed on examination: See above findings.          [facility] Adult PT Treatment/Exercise - 09/02/16 0001      Exercises   Exercises Shoulder     Shoulder Exercises: Supine   Other Supine Exercises 10 reps, 5 sec holds thoracic lifts, head presses with chin tuck     Shoulder Exercises: Seated   Other Seated Exercises scapular retraction 10x 5sec holds.                      PT Long Term Goals - 09/02/16 1423      PT LONG TERM GOAL #1   Title I with advanced HEP for Rt UE (10/14/16)    Time 6   Period Weeks   Status New     PT LONG TERM GOAL #2   Title demo Rt shoulder and elbow ROM WFL to allow her to reach head height shelves with good body mechanics ( 10/14/16)    Time 6   Period Weeks   Status New     PT LONG TERM GOAL #3   Title improve Rt UE strength =/> 4+/5 to allow her to return to her prior household activities ( 10/14/16)    Time 6   Period Weeks   Status New     PT LONG TERM GOAL #4   Title report =/< 1/10 pain when reaching with her Rt UE to perform ADLs ( 10/14/16)    Time 6   Period Weeks   Status New     PT LONG TERM GOAL #5   Title improve FOTO =< 40% limited, CJ level, ( 10/14/16)    Time 6   Period Weeks   Status New                Plan - 09/02/16 1417    Clinical Impression Statement 83 yo female presents ~ 3 months s/p ORIF Rt proximal humerous.  She has been recieving OT at another facility and they have been addressing mainly her elbow and wrist.  She is concerned as she has very limited motion in her  Rt shoulder and this in her dominant side.  She is  limited in her Rt shoulder and elbow motion, has weakness, pain and significant postural changes.     Clinical Presentation Stable   Clinical Decision Making Low   Rehab Potential Good   PT Frequency 2x / week   PT Duration 6 weeks   PT Treatment/Interventions Moist Heat;Ultrasound;Therapeutic exercise;Dry needling;Taping;Vasopneumatic Device;Manual techniques;Neuromuscular re-education;Cryotherapy;Electrical Stimulation;Iontophoresis 4mg /ml Dexamethasone;Patient/family education;Passive range of motion   PT Next Visit Plan Manual work to include DN Rt shoulder, Rt shoulder mobs and stretching to increase ROM, scapular stab ex.    Consulted and Agree with Plan of Care Patient      Patient will benefit from skilled therapeutic intervention in order to improve the following deficits and impairments:  Decreased range of motion, Impaired UE functional use, Increased muscle spasms, Pain, Decreased strength, Postural dysfunction, Improper body mechanics  Visit Diagnosis: Muscle weakness (generalized) - Plan: PT plan of care cert/re-cert  Stiffness of right shoulder, not elsewhere classified - Plan: PT plan of care cert/re-cert  Acute pain of right shoulder - Plan: PT plan of care cert/re-cert  Abnormal posture - Plan: PT plan of care cert/re-cert      [facility] PT PB G-CODES - 09-28-16 1307    Functional Limitations Carrying, moving and handling objects   Carrying, Moving and Handling Objects Current Status (U7253) At least 40 percent but less than 60 percent impaired, limited or restricted   Carrying, Moving and Handling Objects Goal Status (G6440) At least 20 percent but less than 40 percent impaired, limited or restricted       Problem List There are no active problems to display for this patient.   Jeral Pinch PT  09-28-2016, 2:28 PM  [facility] [place], [place], [address] Phone: [phone]   Fax:   [phone]  Name: VIOLANDA BOBECK MRN: [id number] Date of Birth: [birth year]

## 2016-09-07 DIAGNOSIS — I10 Essential (primary) hypertension: Secondary | ICD-10-CM | POA: Diagnosis not present

## 2016-09-07 DIAGNOSIS — N3020 Other chronic cystitis without hematuria: Secondary | ICD-10-CM | POA: Diagnosis not present

## 2016-09-08 DIAGNOSIS — R002 Palpitations: Secondary | ICD-10-CM | POA: Insufficient documentation

## 2016-09-09 ENCOUNTER — Ambulatory Visit (INDEPENDENT_AMBULATORY_CARE_PROVIDER_SITE_OTHER): Payer: Medicare HMO | Admitting: Physical Therapy

## 2016-09-09 ENCOUNTER — Encounter: Payer: Self-pay | Admitting: Physical Therapy

## 2016-09-09 DIAGNOSIS — M25611 Stiffness of right shoulder, not elsewhere classified: Secondary | ICD-10-CM | POA: Diagnosis not present

## 2016-09-09 DIAGNOSIS — R293 Abnormal posture: Secondary | ICD-10-CM | POA: Diagnosis not present

## 2016-09-09 DIAGNOSIS — M25511 Pain in right shoulder: Secondary | ICD-10-CM

## 2016-09-09 DIAGNOSIS — M6281 Muscle weakness (generalized): Secondary | ICD-10-CM

## 2016-09-09 NOTE — Therapy (Signed)
[place], [place], [address] Phone: [phone]   Fax:  [phone]  Physical Therapy Treatment  Patient Details  Name: Shelby Gay MRN: [id number] Date of Birth: 1930/05/19 Referring Provider:  Dr Corbin Ade  Encounter Date: 09/09/2016      PT End of Session - 09/09/16 0917    Visit Number 2   Number of Visits 12   Date for PT Re-Evaluation 10/14/16   PT Start Time 0919   PT Stop Time 1028   PT Time Calculation (min) 69 min   Activity Tolerance Patient tolerated treatment well      Past Medical History:  Diagnosis Date  . Atrial fibrillation ([place])   . Diabetes mellitus without complication ([place])   . Hypertension   . Thyroid disease     History reviewed. No pertinent surgical history.  There were no vitals filed for this visit.      Subjective Assessment - 09/09/16 0923    Subjective Shelby Gay reports she has been exercise about once a day.     Patient Stated Goals she wishes to reach out and up, is very limited    Currently in Pain? No/denies  does have some discomfort with shoulder motions                         [facility] Adult PT Treatment/Exercise - 09/09/16 0001      Shoulder Exercises: Supine   Flexion 5 reps;Right;AAROM  to 90 degrees with eccentric lowering.    Other Supine Exercises 10x10sec thoracif lifts     Shoulder Exercises: Pulleys   Flexion 2 minutes     Modalities   Modalities Moist Heat;Ultrasound     Moist Heat Therapy   Number Minutes Moist Heat 15 Minutes   Moist Heat Location Shoulder  Rt      Ultrasound   Ultrasound Location anterior Rt shoulder, medial to incision   Ultrasound Parameters 50%, 1.19mHz, 1.0w/cm2   Ultrasound Goals Pain     Manual Therapy   Manual Therapy Soft tissue mobilization;Passive ROM   Soft tissue mobilization STM to Rt shoulder posterior muscles and deltoid   Passive ROM Rt shoulder, flex, ER, IR and  abduction          Trigger Point Dry Needling - 09/09/16 0959    Consent Given? Yes   Education Handout Provided Yes   Muscles Treated Upper Body Infraspinatus  teres minor/major and deltoid   Infraspinatus Response Palpable increased muscle length;Twitch response elicited  Rt side all muscles with good twitch                    PT Long Term Goals - 09/09/16 0917      PT LONG TERM GOAL #1   Title I with advanced HEP for Rt UE (10/14/16)    Time 6   Period Weeks   Status On-going     PT LONG TERM GOAL #2   Title demo Rt shoulder and elbow ROM WFL to allow her to reach head height shelves with good body mechanics ( 10/14/16)    Time 6   Period Weeks   Status On-going     PT LONG TERM GOAL #3   Title improve Rt UE strength =/> 4+/5 to allow her to return to her prior household activities ( 10/14/16)    Time 6   Period Weeks   Status On-going     PT  LONG TERM GOAL #4   Title report =/< 1/10 pain when reaching with her Rt UE to perform ADLs ( 10/14/16)    Time 6   Period Weeks   Status On-going     PT LONG TERM GOAL #5   Title improve FOTO =< 40% limited, CJ level, ( 10/14/16)    Time 6   Period Weeks   Status On-going               Plan - 09/09/16 1015    Clinical Impression Statement This is Shelby Gay second visit, she tolerated DN and STM well, has pain with Rt shoulder stretching.  She reports some relief with ultrasound to anterior shoulder, there is a palpable piece of hardware at her tender spot. Motion and strength continue to be defecit.    Rehab Potential Good   PT Frequency 2x / week   PT Duration 6 weeks   PT Treatment/Interventions Moist Heat;Ultrasound;Therapeutic exercise;Dry needling;Taping;Vasopneumatic Device;Manual techniques;Neuromuscular re-education;Cryotherapy;Electrical Stimulation;Iontophoresis 4mg /ml Dexamethasone;Patient/family education;Passive range of motion   PT Next Visit Plan ROM/manual stretching Rt shoulder, scap stab  ex, bicep strengthening and modalites PRN   Consulted and Agree with Plan of Care Patient      Patient will benefit from skilled therapeutic intervention in order to improve the following deficits and impairments:  Decreased range of motion, Impaired UE functional use, Increased muscle spasms, Pain, Decreased strength, Postural dysfunction, Improper body mechanics  Visit Diagnosis: Muscle weakness (generalized)  Stiffness of right shoulder, not elsewhere classified  Acute pain of right shoulder  Abnormal posture     Problem List Patient Active Problem List   Diagnosis Date Noted  . Palpitations 09/08/2016    Jeral Pinch PT  09/09/2016, 10:18 AM  [facility] [place], [place], [address] Phone: [phone]   Fax:  [phone]  Name: Shelby Gay MRN: [id number] Date of Birth: 04/10/1930

## 2016-09-11 ENCOUNTER — Ambulatory Visit (INDEPENDENT_AMBULATORY_CARE_PROVIDER_SITE_OTHER): Payer: Medicare HMO | Admitting: Physical Therapy

## 2016-09-11 DIAGNOSIS — M25511 Pain in right shoulder: Secondary | ICD-10-CM | POA: Diagnosis not present

## 2016-09-11 DIAGNOSIS — M6281 Muscle weakness (generalized): Secondary | ICD-10-CM | POA: Diagnosis not present

## 2016-09-11 DIAGNOSIS — R293 Abnormal posture: Secondary | ICD-10-CM | POA: Diagnosis not present

## 2016-09-11 DIAGNOSIS — M25611 Stiffness of right shoulder, not elsewhere classified: Secondary | ICD-10-CM

## 2016-09-11 NOTE — Therapy (Signed)
[place], [place], [address] Phone: [phone]   Fax:  [phone]  Physical Therapy Treatment  Patient Details  Name: Shelby Gay MRN: [id number] Date of Birth: 1930-06-17 Referring Provider:  Dr Corbin Ade  Encounter Date: 09/11/2016      PT End of Session - 09/11/16 1022    Visit Number 3   Number of Visits 12   Date for PT Re-Evaluation 10/14/16   PT Start Time 1018   PT Stop Time 1115   PT Time Calculation (min) 57 min   Activity Tolerance Patient tolerated treatment well   Behavior During Therapy [facility] for tasks assessed/performed      Past Medical History:  Diagnosis Date  . Atrial fibrillation ([place])   . Diabetes mellitus without complication ([place])   . Hypertension   . Thyroid disease     No past surgical history on file.  There were no vitals filed for this visit.      Subjective Assessment - 09/11/16 1022    Subjective Pt reports she has been doing her exercises daily (both the ones we have issued her and the ones given at [facility] therapy).  "I'm just sore today"   Currently in Pain? Yes   Pain Score 3    Pain Location Shoulder  prox humerus, pec   Pain Orientation Right   Aggravating Factors  pain if overdoing exercises.    Pain Relieving Factors heat, biofreeze, TENS            [facility] PT Assessment - 09/11/16 0001      Assessment   Medical Diagnosis s/p ORIF proximal Rt humeral fx   Onset Date/Surgical Date 04/16/16  surgery 05/13/16   Hand Dominance Right   Next MD Visit PRN     AROM   Right/Left Shoulder Right   Right Shoulder Flexion 105 Degrees  AAROM with dowel, supine   Right Shoulder External Rotation 32 Degrees  supine, arm abdct 40 deg           [facility] Adult PT Treatment/Exercise - 09/11/16 0001      Shoulder Exercises: Supine   External Rotation AAROM;Right;10 reps  with cane   Flexion Right;AAROM;10 reps  to 105 degrees with  eccentric lowering.    Other Supine Exercises 10x10sec thoracic lifts, bench press with dowel x 10 reps     Shoulder Exercises: Standing   Other Standing Exercises wall ladder, RUE flexion, x 5 reps    Other Standing Exercises pendulum forward/back, circles x 10 each      Shoulder Exercises: Pulleys   Flexion 3 minutes     Modalities   Modalities Moist Heat;Electrical Stimulation     Moist Heat Therapy   Number Minutes Moist Heat 15 Minutes   Moist Heat Location Shoulder  Rt      Electrical Stimulation   Electrical Stimulation Location Rt shoulder    Electrical Stimulation Action IFC   Electrical Stimulation Parameters to tolerance    Electrical Stimulation Goals Pain     Manual Therapy   Manual Therapy Myofascial release;Soft tissue mobilization;Passive ROM;Taping   Manual therapy comments I strip of sensitive skin Rock tape applied over incision on Rt shoulder to decompress tissue and sensitivity, assist with scar management.    Soft tissue mobilization STM to Rt shoulder anterior muscles and deltoid   Myofascial Release MFR to Rt pec    Passive ROM Rt shoulder, flex, ER, and scaption (much guarding)  PT Education - 09/11/16 1119    Education provided Yes   Education Details info on kinesiotape   Person(s) Educated Patient   Methods Explanation;Handout   Comprehension Verbalized understanding             PT Long Term Goals - 09/09/16 0917      PT LONG TERM GOAL #1   Title I with advanced HEP for Rt UE (10/14/16)    Time 6   Period Weeks   Status On-going     PT LONG TERM GOAL #2   Title demo Rt shoulder and elbow ROM WFL to allow her to reach head height shelves with good body mechanics ( 10/14/16)    Time 6   Period Weeks   Status On-going     PT LONG TERM GOAL #3   Title improve Rt UE strength =/> 4+/5 to allow her to return to her prior household activities ( 10/14/16)    Time 6   Period Weeks   Status On-going     PT LONG  TERM GOAL #4   Title report =/< 1/10 pain when reaching with her Rt UE to perform ADLs ( 10/14/16)    Time 6   Period Weeks   Status On-going     PT LONG TERM GOAL #5   Title improve FOTO =< 40% limited, CJ level, ( 10/14/16)    Time 6   Period Weeks   Status On-going               Plan - 09/11/16 1346    Clinical Impression Statement Pt demonstrating improved Rt shoulder flexion in supine. Guarding noted with manual therapy.  Pt reported slight increase in pain with AROM/AAROM exercises; reduced with rest and estim/MHP at end of session.  Pt progressing towards goals.     Rehab Potential Good   PT Frequency 2x / week   PT Duration 6 weeks   PT Treatment/Interventions Moist Heat;Ultrasound;Therapeutic exercise;Dry needling;Taping;Vasopneumatic Device;Manual techniques;Neuromuscular re-education;Cryotherapy;Electrical Stimulation;Iontophoresis 4mg /ml Dexamethasone;Patient/family education;Passive range of motion   PT Next Visit Plan ROM/manual stretching Rt shoulder, scap stab ex, bicep strengthening and modalites PRN   Consulted and Agree with Plan of Care Patient      Patient will benefit from skilled therapeutic intervention in order to improve the following deficits and impairments:  Decreased range of motion, Impaired UE functional use, Increased muscle spasms, Pain, Decreased strength, Postural dysfunction, Improper body mechanics  Visit Diagnosis: Stiffness of right shoulder, not elsewhere classified  Acute pain of right shoulder  Muscle weakness (generalized)  Abnormal posture     Problem List Patient Active Problem List   Diagnosis Date Noted  . Palpitations 09/08/2016   Kerin Perna, PTA 09/11/16 1:51 PM  [facility] [place], [place], [address] Phone: [phone]   Fax:  [phone]  Name: [place] Gay MRN: [id number] Date of Birth: [birth year]

## 2016-09-11 NOTE — Patient Instructions (Signed)

## 2016-09-15 ENCOUNTER — Encounter: Payer: Self-pay | Admitting: Physical Therapy

## 2016-09-15 ENCOUNTER — Ambulatory Visit (INDEPENDENT_AMBULATORY_CARE_PROVIDER_SITE_OTHER): Payer: Medicare HMO | Admitting: Physical Therapy

## 2016-09-15 DIAGNOSIS — M25511 Pain in right shoulder: Secondary | ICD-10-CM

## 2016-09-15 DIAGNOSIS — M6281 Muscle weakness (generalized): Secondary | ICD-10-CM

## 2016-09-15 DIAGNOSIS — R293 Abnormal posture: Secondary | ICD-10-CM | POA: Diagnosis not present

## 2016-09-15 DIAGNOSIS — M25611 Stiffness of right shoulder, not elsewhere classified: Secondary | ICD-10-CM

## 2016-09-15 NOTE — Therapy (Signed)
[place], [place], [address] Phone: [phone]   Fax:  [phone]  Physical Therapy Treatment  Patient Details  Name: QUANISHA DREWRY MRN: [id number] Date of Birth: Feb 03, 1930 Referring Provider: Dr Judeth Cornfield  Encounter Date: 09/15/2016      PT End of Session - 09/15/16 1106    Visit Number 4   Number of Visits 12   Date for PT Re-Evaluation 10/14/16   PT Start Time 1106   PT Stop Time 1157   PT Time Calculation (min) 51 min   Activity Tolerance Patient tolerated treatment well      Past Medical History:  Diagnosis Date  . Atrial fibrillation ([place])   . Diabetes mellitus without complication ([place])   . Hypertension   . Thyroid disease     History reviewed. No pertinent surgical history.  There were no vitals filed for this visit.      Subjective Assessment - 09/15/16 1108    Subjective Pt reports she was sick over the weekend and didn't do much exercise until yesterday.  She reports having shoulder pain more at night. She will be driving her sisters to the beach in 2 weeks   Patient Stated Goals she wishes to reach out and up, is very limited    Currently in Pain? Yes   Pain Score 4    Pain Location Shoulder   Pain Orientation Right   Pain Descriptors / Indicators Aching  deep   Pain Type Acute pain   Pain Onset More than a month ago   Pain Frequency Constant   Aggravating Factors  latera in the day   Pain Relieving Factors heat, biofreeze, tens            [facility] PT Assessment - 09/15/16 0001      Assessment   Medical Diagnosis s/p ORIF proximal Rt humeral fx   Referring Provider Dr Judeth Cornfield   Onset Date/Surgical Date 04/16/16   Hand Dominance Right     AROM   Right/Left Shoulder Right  measured in supine   Right Shoulder Flexion 117 Degrees   Right Shoulder ABduction 85 Degrees   Right Shoulder Internal Rotation 72 Degrees   Right Shoulder External Rotation 38  Degrees   Right Elbow Extension -12                     [facility] Adult PT Treatment/Exercise - 09/15/16 0001      Elbow Exercises   Elbow Flexion Strengthening;10 reps  triceps with red band     Shoulder Exercises: Supine   Horizontal ABduction Strengthening;Right;10 reps;Theraband   Theraband Level (Shoulder Horizontal ABduction) Level 1 (Yellow)   Flexion Other (comment)  rythmic stabilization at 90 degrees      Shoulder Exercises: Pulleys   Flexion 3 minutes   Flexion Limitations VC to keep shoulder down   ABduction 3 minutes     Shoulder Exercises: Stretch   Other Shoulder Stretches snow angel stretch on small pool noodle up back x2' Low  Load, long duration Rt shoulder ER stretch holding 1#, for 5'      Modalities   Modalities Moist Heat;Electrical Stimulation     Moist Heat Therapy   Number Minutes Moist Heat 15 Minutes   Moist Heat Location Shoulder     Electrical Stimulation   Electrical Stimulation Location Rt shoulder    Electrical Stimulation Action IFC   Electrical Stimulation Parameters to tolerance   Electrical Stimulation  Goals Pain     Manual Therapy   Manual Therapy Soft tissue mobilization;Joint mobilization;Passive ROM   Joint Mobilization Rt shoulder GH joint posterior and inferior mobs   Soft tissue mobilization STM to Rt shoulder anterior muscles and deltoid and pecs   Myofascial Release MFR to Rt pec    Passive ROM Rt shoulder for flex, abduct, ER, IR                      PT Long Term Goals - 09/15/16 1111      PT LONG TERM GOAL #1   Title I with advanced HEP for Rt UE (10/14/16)    Status On-going     PT LONG TERM GOAL #2   Title demo Rt shoulder and elbow ROM WFL to allow her to reach head height shelves with good body mechanics ( 10/14/16)    Status On-going     PT LONG TERM GOAL #3   Title improve Rt UE strength =/> 4+/5 to allow her to return to her prior household activities ( 10/14/16)    Status On-going      PT LONG TERM GOAL #4   Title report =/< 1/10 pain when reaching with her Rt UE to perform ADLs ( 10/14/16)    Status On-going     PT LONG TERM GOAL #5   Title improve FOTO =< 40% limited, CJ level, ( 10/14/16)    Status On-going               Plan - 09/15/16 1146    Clinical Impression Statement [place] has improved Rt shoulder ROM, she does have compensatory motion and requires physical assist to maintain proper alignment when moving the arm.  Making progress to her goals, slowly add in strengthening as motion improves.     Rehab Potential Good   PT Frequency 2x / week   PT Treatment/Interventions Moist Heat;Ultrasound;Therapeutic exercise;Dry needling;Taping;Vasopneumatic Device;Manual techniques;Neuromuscular re-education;Cryotherapy;Electrical Stimulation;Iontophoresis 4mg /ml Dexamethasone;Patient/family education;Passive range of motion   PT Next Visit Plan ROM/manual stretching Rt shoulder, scap stab ex, bicep strengthening and modalites PRN   Consulted and Agree with Plan of Care Patient      Patient will benefit from skilled therapeutic intervention in order to improve the following deficits and impairments:  Decreased range of motion, Impaired UE functional use, Increased muscle spasms, Pain, Decreased strength, Postural dysfunction, Improper body mechanics  Visit Diagnosis: Stiffness of right shoulder, not elsewhere classified  Acute pain of right shoulder  Muscle weakness (generalized)  Abnormal posture     Problem List Patient Active Problem List   Diagnosis Date Noted  . Palpitations 09/08/2016    Jeral Pinch PT  09/15/2016, 11:48 AM  [facility] [place], [place], [address] Phone: [phone]   Fax:  [phone]  Name: SHADELL BRENN MRN: [id number] Date of Birth: May 08, 1930

## 2016-09-17 ENCOUNTER — Ambulatory Visit (INDEPENDENT_AMBULATORY_CARE_PROVIDER_SITE_OTHER): Payer: Medicare HMO | Admitting: Physical Therapy

## 2016-09-17 DIAGNOSIS — M25611 Stiffness of right shoulder, not elsewhere classified: Secondary | ICD-10-CM | POA: Diagnosis not present

## 2016-09-17 DIAGNOSIS — R293 Abnormal posture: Secondary | ICD-10-CM

## 2016-09-17 DIAGNOSIS — M25511 Pain in right shoulder: Secondary | ICD-10-CM

## 2016-09-17 DIAGNOSIS — M6281 Muscle weakness (generalized): Secondary | ICD-10-CM | POA: Diagnosis not present

## 2016-09-17 NOTE — Therapy (Signed)
[place], [place], [address] Phone: [phone]   Fax:  [phone]  Physical Therapy Treatment  Patient Details  Name: Shelby Gay MRN: [id number] Date of Birth: [birth year] Referring Provider: Dr. Judeth Cornfield  Encounter Date: 09/17/2016      PT End of Session - 09/17/16 1143    Visit Number 5   Number of Visits 12   Date for PT Re-Evaluation 10/14/16   PT Start Time 3614   PT Stop Time 1245   PT Time Calculation (min) 60 min   Activity Tolerance Patient tolerated treatment well   Behavior During Therapy [facility] for tasks assessed/performed      Past Medical History:  Diagnosis Date  . Atrial fibrillation ([place])   . Diabetes mellitus without complication ([place])   . Hypertension   . Thyroid disease     No past surgical history on file.  There were no vitals filed for this visit.      Subjective Assessment - 09/17/16 1203    Subjective Pt reports she is feeling much better today (no longer sick).  She feels like she is able to reach arm a little further with pulleys.  She feels the Rock tape helped reduce sensitivity.    Patient Stated Goals she wishes to reach out and up, is very limited    Currently in Pain? No/denies   Pain Score 0-No pain            [facility] PT Assessment - 09/17/16 0001      Assessment   Medical Diagnosis s/p ORIF proximal Rt humeral fx   Referring Provider Dr. Judeth Cornfield   Onset Date/Surgical Date 04/16/16   Hand Dominance Right   Next MD Visit PRN   Prior Therapy yes just this past month          [facility] Adult PT Treatment/Exercise - 09/17/16 0001      Elbow Exercises   Elbow Flexion Right;Supine;15 reps   Bar Weights/Barbell (Elbow Flexion) 1 lb  eccentric control, performed in supine     Shoulder Exercises: Supine   Horizontal ABduction Strengthening;Both;5 reps;Theraband;AROM  5 reps AROM, 5 reps resistance   Theraband Level (Shoulder Horizontal  ABduction) Level 1 (Yellow)   Other Supine Exercises hooklying on noodle:  prolonged horiz abdct for 2 min, then 5 snow angels to tolerance    Other Supine Exercises Prolonged Rt shoulder ER with 1# wt in hand x 1 min, 2 reps.   AAROM with RUE to reach hand onto top of head x 10 reps      Shoulder Exercises: Seated   Other Seated Exercises scapular retraction 10x 5sec holds.   VC for breath     Shoulder Exercises: Pulleys   Flexion 3 minutes   Flexion Limitations VC to keep shoulder down   ABduction 3 minutes     Shoulder Exercises: ROM/Strengthening   Rhythmic Stabilization, Supine Rt shoulder at 45 deg x 15 sec each x 3 reps      Modalities   Modalities Electrical Stimulation;Moist Heat     Moist Heat Therapy   Number Minutes Moist Heat 15 Minutes   Moist Heat Location Shoulder     Electrical Stimulation   Electrical Stimulation Location Rt shoulder    Electrical Stimulation Action IFC   Electrical Stimulation Parameters to tolerance   Electrical Stimulation Goals Pain     Manual Therapy   Soft tissue mobilization STM to Rt shoulder anterior muscles and deltoid  and pecs   Myofascial Release MFR to Rt pec    Passive ROM Rt shoulder for flex, abduct, ER.                       PT Long Term Goals - 09/15/16 1111      PT LONG TERM GOAL #1   Title I with advanced HEP for Rt UE (10/14/16)    Status On-going     PT LONG TERM GOAL #2   Title demo Rt shoulder and elbow ROM WFL to allow her to reach head height shelves with good body mechanics ( 10/14/16)    Status On-going     PT LONG TERM GOAL #3   Title improve Rt UE strength =/> 4+/5 to allow her to return to her prior household activities ( 10/14/16)    Status On-going     PT LONG TERM GOAL #4   Title report =/< 1/10 pain when reaching with her Rt UE to perform ADLs ( 10/14/16)    Status On-going     PT LONG TERM GOAL #5   Title improve FOTO =< 40% limited, CJ level, ( 10/14/16)    Status On-going                Plan - 09/17/16 1242    Clinical Impression Statement Pt making gradual gains in strength and ROM with RUE each visit.  She needs freq cues to avoid compensatory motion in shoulder. Pt progressing towards goals.    Rehab Potential Good   PT Frequency 2x / week   PT Duration 6 weeks   PT Treatment/Interventions Moist Heat;Ultrasound;Therapeutic exercise;Dry needling;Taping;Vasopneumatic Device;Manual techniques;Neuromuscular re-education;Cryotherapy;Electrical Stimulation;Iontophoresis 4mg /ml Dexamethasone;Patient/family education;Passive range of motion   PT Next Visit Plan ROM/manual stretching Rt shoulder, scap stab ex, bicep strengthening and modalites PRN   Consulted and Agree with Plan of Care Patient      Patient will benefit from skilled therapeutic intervention in order to improve the following deficits and impairments:  Decreased range of motion, Impaired UE functional use, Increased muscle spasms, Pain, Decreased strength, Postural dysfunction, Improper body mechanics  Visit Diagnosis: Stiffness of right shoulder, not elsewhere classified  Acute pain of right shoulder  Muscle weakness (generalized)  Abnormal posture     Problem List Patient Active Problem List   Diagnosis Date Noted  . Palpitations 09/08/2016   Kerin Perna, PTA 09/17/16 12:57 PM  [place], [place], [address] Phone: [phone]   Fax:  [phone]  Name: Shelby Gay MRN: [id number] Date of Birth: December 24, 1930

## 2016-09-18 ENCOUNTER — Ambulatory Visit (INDEPENDENT_AMBULATORY_CARE_PROVIDER_SITE_OTHER): Payer: Medicare HMO

## 2016-09-18 DIAGNOSIS — R002 Palpitations: Secondary | ICD-10-CM

## 2016-09-21 DIAGNOSIS — N3020 Other chronic cystitis without hematuria: Secondary | ICD-10-CM | POA: Diagnosis not present

## 2016-09-22 ENCOUNTER — Ambulatory Visit (INDEPENDENT_AMBULATORY_CARE_PROVIDER_SITE_OTHER): Payer: Medicare HMO | Admitting: Physical Therapy

## 2016-09-22 DIAGNOSIS — M25511 Pain in right shoulder: Secondary | ICD-10-CM

## 2016-09-22 DIAGNOSIS — M6281 Muscle weakness (generalized): Secondary | ICD-10-CM | POA: Diagnosis not present

## 2016-09-22 DIAGNOSIS — M25611 Stiffness of right shoulder, not elsewhere classified: Secondary | ICD-10-CM

## 2016-09-22 DIAGNOSIS — R293 Abnormal posture: Secondary | ICD-10-CM | POA: Diagnosis not present

## 2016-09-22 NOTE — Therapy (Signed)
[place], [place], [address] Phone: [phone]   Fax:  [phone]  Physical Therapy Treatment  Patient Details  Name: Shelby Gay MRN: [id number] Date of Birth: 17-Sep-1930 Referring Provider: Dr. Judeth Cornfield  Encounter Date: 09/22/2016      PT End of Session - 09/22/16 1032    Visit Number 6   Number of Visits 12   Date for PT Re-Evaluation 10/14/16   PT Start Time 9417   PT Stop Time 1108   PT Time Calculation (min) 53 min      Past Medical History:  Diagnosis Date  . Atrial fibrillation ([place])   . Diabetes mellitus without complication ([place])   . Hypertension   . Thyroid disease     No past surgical history on file.  There were no vitals filed for this visit.      Subjective Assessment - 09/22/16 1027    Subjective Shawntina reports no new changes with her shoulder since last visit.  She does have a heart monitor on; will have it on for one month.  She is leaving for beach on 22nd and would like to know what the best exercises will be for trip.    Patient Stated Goals she wishes to reach out and up, is very limited    Currently in Pain? No/denies   Pain Score 0-No pain            [facility] PT Assessment - 09/22/16 0001      Assessment   Medical Diagnosis s/p ORIF proximal Rt humeral fx   Referring Provider Dr. Judeth Cornfield   Onset Date/Surgical Date 04/16/16   Hand Dominance Right   Next MD Visit PRN     AROM   Right Shoulder Flexion 115 Degrees  AAROM with dowel, supine   Right Shoulder External Rotation 38 Degrees  supine, arm abd ~40 deg          [facility] Adult PT Treatment/Exercise - 09/22/16 0001      Self-Care   Self-Care Other Self-Care Comments   Other Self-Care Comments  Pt educated on self massage with ball against Rt lateral/ posterior shoulder muscles to decrease tightness/ sensitivity.  Pt verbalized understanding and returned demo.      Shoulder  Exercises: Supine   Horizontal ABduction AAROM;Both;5 reps   Flexion AAROM;Both;10 reps  dowel, 5 sec hold.    Other Supine Exercises Prolonged Rt shoulder ER with 1# wt in hand x 1 min, 2 reps.   AAROM with RUE to reach hand onto top of head x 10 reps.  Prolonged Rt abduct to tolerance for 30 sec x 2       Shoulder Exercises: Seated   Other Seated Exercises scap depression with Rt hand on green therapy ball x 5 sec, 10 reps; then scap depression with small circles CW/CCW, flex/ext, then rhythmic stabilization x 15-30 sec x 4 reps.      Shoulder Exercises: Pulleys   Flexion 3 minutes   ABduction 3 minutes     Moist Heat Therapy   Number Minutes Moist Heat 12 Minutes   Moist Heat Location Shoulder     Electrical Stimulation   Electrical Stimulation Location --  Held due to heart monitor     Manual Therapy   Soft tissue mobilization STM to Rt shoulder posterior muscles, deltoid and pecs.  Pin and stretch of scapula with passive Rt shoulder scaption (very tender in teres/ infraspinatus)   Myofascial Release  MFR to Rt pec    Passive ROM Rt shoulder for flex, abduct, ER.    pt very guarded.                      PT Long Term Goals - 09/15/16 1111      PT LONG TERM GOAL #1   Title I with advanced HEP for Rt UE (10/14/16)    Status On-going     PT LONG TERM GOAL #2   Title demo Rt shoulder and elbow ROM WFL to allow her to reach head height shelves with good body mechanics ( 10/14/16)    Status On-going     PT LONG TERM GOAL #3   Title improve Rt UE strength =/> 4+/5 to allow her to return to her prior household activities ( 10/14/16)    Status On-going     PT LONG TERM GOAL #4   Title report =/< 1/10 pain when reaching with her Rt UE to perform ADLs ( 10/14/16)    Status On-going     PT LONG TERM GOAL #5   Title improve FOTO =< 40% limited, CJ level, ( 10/14/16)    Status On-going               Plan - 09/22/16 1030    Clinical Impression Statement Pt  reported 4/10 pain in Rt shoulder with exercise (pulleys; AAROM) today; resolved with rest and heat.  She was point tender in posterior shoulder girdle with manual therapy. Continues to be guarded with attempts of PROM.  She is making gradual progress towards established goals.    Rehab Potential Good   PT Frequency 2x / week   PT Duration 6 weeks   PT Treatment/Interventions Moist Heat;Ultrasound;Therapeutic exercise;Dry needling;Taping;Vasopneumatic Device;Manual techniques;Neuromuscular re-education;Cryotherapy;Electrical Stimulation;Iontophoresis 4mg /ml Dexamethasone;Patient/family education;Passive range of motion   PT Next Visit Plan ROM/manual stretching Rt shoulder, scap stab ex, bicep strengthening and modalites PRN   Consulted and Agree with Plan of Care Patient      Patient will benefit from skilled therapeutic intervention in order to improve the following deficits and impairments:  Decreased range of motion, Impaired UE functional use, Increased muscle spasms, Pain, Decreased strength, Postural dysfunction, Improper body mechanics  Visit Diagnosis: Stiffness of right shoulder, not elsewhere classified  Acute pain of right shoulder  Muscle weakness (generalized)  Abnormal posture     Problem List Patient Active Problem List   Diagnosis Date Noted  . Palpitations 09/08/2016   Kerin Perna, PTA 09/22/16 12:59 PM  [place], [place], [address] Phone: [phone]   Fax:  [phone]  Name: Shelby Gay MRN: [id number] Date of Birth: 1930/02/07

## 2016-09-24 ENCOUNTER — Encounter: Payer: Medicare HMO | Admitting: Physical Therapy

## 2016-09-29 ENCOUNTER — Ambulatory Visit (INDEPENDENT_AMBULATORY_CARE_PROVIDER_SITE_OTHER): Payer: Medicare HMO | Admitting: Physical Therapy

## 2016-09-29 ENCOUNTER — Encounter: Payer: Self-pay | Admitting: Physical Therapy

## 2016-09-29 DIAGNOSIS — M25611 Stiffness of right shoulder, not elsewhere classified: Secondary | ICD-10-CM | POA: Diagnosis not present

## 2016-09-29 DIAGNOSIS — M6281 Muscle weakness (generalized): Secondary | ICD-10-CM

## 2016-09-29 DIAGNOSIS — R293 Abnormal posture: Secondary | ICD-10-CM | POA: Diagnosis not present

## 2016-09-29 DIAGNOSIS — M25511 Pain in right shoulder: Secondary | ICD-10-CM | POA: Diagnosis not present

## 2016-09-29 NOTE — Patient Instructions (Addendum)
Progressive Resisted: Flexion (Supine)    Holding _1___ pound weight or soup can, raise arms over head and lower toward floor. Go as far as possible without pain. Repeat _10-20___ times per set. Do __1_ sets per session. Do _1___ sessions per day.   Progressive Resisted: External Rotation (Side-Lying)    Holding __1__ pound weight or a soup can, towel under arm, raise right forearm toward ceiling. Keep elbow bent and at side. Repeat _10-20___ times per set. Do _1___ sets per session. Do __1__ sessions per day.  Elbow Press: Supported - use towel under elbows if needed for comfort    Interlace fingers; bring hands underneath head. Support both elbows with folded towel. Press elbows down. Hold ___ seconds. Relax arms. Repeat 10-20_ times. Once a day.   Side Pull: Single Arm    On back, knees bent, feet flat. Arms perpendicular to body, shoulder level. Keep left arm still. Pull right arm out to side, elbow straight, hand down toward floor. Hold momentarily. Slowly return to starting position. Repeat _10-20__ times. Do with left arm. Band color ___yellow___Once a day.

## 2016-09-29 NOTE — Therapy (Signed)
[place], [place], [address] Phone: [phone]   Fax:  [phone]  Physical Therapy Treatment  Patient Details  Name: Shelby Gay MRN: [id number] Date of Birth: 1930/05/19 Referring Provider: Dr Judeth Cornfield  Encounter Date: 09/29/2016      PT End of Session - 09/29/16 1018    Visit Number 7   Number of Visits 12   Date for PT Re-Evaluation 10/14/16   PT Start Time 1018   PT Stop Time 1113   PT Time Calculation (min) 55 min      Past Medical History:  Diagnosis Date  . Atrial fibrillation ([place])   . Diabetes mellitus without complication ([place])   . Hypertension   . Thyroid disease     History reviewed. No pertinent surgical history.  There were no vitals filed for this visit.      Subjective Assessment - 09/29/16 1019    Subjective Shelby Gay is still wearing the heart monitor, has two more weeks of this.  Planning on going to the beach for a week next week.    Patient Stated Goals she wishes to reach out and up, is very limited    Currently in Pain? Yes   Pain Score 2    Pain Location Shoulder   Pain Orientation Right   Pain Descriptors / Indicators Sore   Pain Type Acute pain   Aggravating Factors  moving it around   Pain Relieving Factors heat, tens            [facility] PT Assessment - 09/29/16 0001      Assessment   Medical Diagnosis s/p ORIF proximal Rt humeral fx   Referring Provider Dr Judeth Cornfield   Onset Date/Surgical Date 04/16/16   Hand Dominance Right   Next MD Visit PRN     AROM   Right/Left Shoulder Right   Right Shoulder Flexion 135 Degrees   Right Shoulder External Rotation 44 Degrees     PROM   Right Shoulder Flexion 146 Degrees   Right Shoulder ABduction 113 Degrees   Right Shoulder External Rotation 60 Degrees                     [facility] Adult PT Treatment/Exercise - 09/29/16 0001      Shoulder Exercises: Supine   Horizontal ABduction  Strengthening;Right;20 reps;Theraband   Theraband Level (Shoulder Horizontal ABduction) Level 1 (Yellow)   Flexion Strengthening;Right;10 reps;Weights  lifting to 90 degrees   Shoulder Flexion Weight (lbs) 1   Other Supine Exercises 15 reps elbow presses with hands behind head     Shoulder Exercises: Prone   Other Prone Exercises 20 reps bilat, bent elbow row, T;s off corner of mat     Shoulder Exercises: Sidelying   External Rotation Strengthening;Right;20 reps;Weights  towel under elbow   External Rotation Weight (lbs) 1     Shoulder Exercises: ROM/Strengthening   UBE (Upper Arm Bike) L1x4' alt FWD/BWD, Lt UE helping Rt      Modalities   Modalities Moist Heat     Moist Heat Therapy   Number Minutes Moist Heat 15 Minutes   Moist Heat Location Shoulder  Rt     Manual Therapy   Manual Therapy Soft tissue mobilization;Joint mobilization;Passive ROM   Joint Mobilization Rt GH joint, inferior and posterior grade III   Myofascial Release Rt shoulder distraction, arm pull   Passive ROM Rt shoulder for flex, abduct, ER.  PT Education - 09/29/16 1054    Education provided Yes   Education Details HEP initial strengthening   Person(s) Educated Patient   Methods Explanation;Demonstration;Handout   Comprehension Returned demonstration;Verbalized understanding             PT Long Term Goals - 09/29/16 1059      PT LONG TERM GOAL #1   Title I with advanced HEP for Rt UE (10/14/16)    Status On-going     PT LONG TERM GOAL #2   Title demo Rt shoulder and elbow ROM WFL to allow her to reach head height shelves with good body mechanics ( 10/14/16)    Status Partially Met  elbow WNL     PT LONG TERM GOAL #3   Title improve Rt UE strength =/> 4+/5 to allow her to return to her prior household activities ( 10/14/16)    Status On-going     PT LONG TERM GOAL #4   Title report =/< 1/10 pain when reaching with her Rt UE to perform ADLs ( 10/14/16)    Status  On-going     PT LONG TERM GOAL #5   Title improve FOTO =< 40% limited, CJ level, ( 10/14/16)    Status On-going               Plan - 09/29/16 1100    Clinical Impression Statement Shelby Gay had a big increase in Rt shoulder ROM at todays visit, responded well to manual stretching and is ready for initial strenghening.  These were issued to HEP.  Her arm does fatigue quickly.  She has partially met her goals and making progress.    Rehab Potential Good   PT Frequency 2x / week   PT Duration 6 weeks   PT Treatment/Interventions Moist Heat;Ultrasound;Therapeutic exercise;Dry needling;Taping;Vasopneumatic Device;Manual techniques;Neuromuscular re-education;Cryotherapy;Electrical Stimulation;Iontophoresis 81m/ml Dexamethasone;Patient/family education;Passive range of motion   PT Next Visit Plan review new HEP, continue to improve shoudler ROM, scap stability.    Consulted and Agree with Plan of Care Patient      Patient will benefit from skilled therapeutic intervention in order to improve the following deficits and impairments:  Decreased range of motion, Impaired UE functional use, Increased muscle spasms, Pain, Decreased strength, Postural dysfunction, Improper body mechanics  Visit Diagnosis: Stiffness of right shoulder, not elsewhere classified  Acute pain of right shoulder  Muscle weakness (generalized)  Abnormal posture     Problem List Patient Active Problem List   Diagnosis Date Noted  . Palpitations 09/08/2016    SJeral PinchPT  09/29/2016, 11:02 AM  [facility] [address]Phone: [phone]  Fax:  [phone] Name: Shelby FASIGMRN: [id number] of Birth: [birth year]

## 2016-10-01 ENCOUNTER — Encounter: Payer: Self-pay | Admitting: Physical Therapy

## 2016-10-01 ENCOUNTER — Ambulatory Visit (INDEPENDENT_AMBULATORY_CARE_PROVIDER_SITE_OTHER): Payer: Medicare HMO | Admitting: Physical Therapy

## 2016-10-01 DIAGNOSIS — M25611 Stiffness of right shoulder, not elsewhere classified: Secondary | ICD-10-CM

## 2016-10-01 DIAGNOSIS — R293 Abnormal posture: Secondary | ICD-10-CM

## 2016-10-01 DIAGNOSIS — M6281 Muscle weakness (generalized): Secondary | ICD-10-CM | POA: Diagnosis not present

## 2016-10-01 DIAGNOSIS — M25511 Pain in right shoulder: Secondary | ICD-10-CM | POA: Diagnosis not present

## 2016-10-01 NOTE — Therapy (Signed)
[place], [place], [address] Phone: [phone]   Fax:  [phone]  Physical Therapy Treatment  Patient Details  Name: Shelby Gay MRN: [id number] Date of Birth: 1930/03/27 Referring Provider: Dr Judeth Cornfield  Encounter Date: 10/01/2016      PT End of Session - 10/01/16 1054    Visit Number 8   Number of Visits 12   Date for PT Re-Evaluation 10/14/16   PT Start Time 1055   PT Stop Time 1146   PT Time Calculation (min) 51 min   Activity Tolerance Patient limited by pain  after DN, manual and PROM      Past Medical History:  Diagnosis Date  . Atrial fibrillation ([place])   . Diabetes mellitus without complication ([place])   . Hypertension   . Thyroid disease     History reviewed. No pertinent surgical history.  There were no vitals filed for this visit.      Subjective Assessment - 10/01/16 1056    Subjective Pt reports she did the pulley this AM, arm is sore today.    Currently in Pain? Yes   Pain Score 3    Pain Location Shoulder   Pain Orientation Right   Pain Descriptors / Indicators Sore   Pain Type Acute pain   Pain Onset More than a month ago                         [facility] Adult PT Treatment/Exercise - 10/01/16 0001      Shoulder Exercises: Standing   Flexion Strengthening;AROM;Right;15 reps  shoulder ladder, VC to keep scapula down     Shoulder Exercises: ROM/Strengthening   UBE (Upper Arm Bike) L1x4' alt FWD/BWD, Lt UE helping Rt      Shoulder Exercises: Stretch   Other Shoulder Stretches anterior shoulder stretch for pecs and biceps with Rt arm straight, 2x45sec     Modalities   Modalities Moist Heat     Moist Heat Therapy   Number Minutes Moist Heat 12 Minutes   Moist Heat Location Shoulder     Manual Therapy   Manual Therapy Soft tissue mobilization;Joint mobilization;Passive ROM   Joint Mobilization Rt GH joint, inferior and posterior  grade III   Soft tissue mobilization STM posterior Rt shoulder muscles and pecs   Passive ROM Rt shoulder for flex, ER.            Trigger Point Dry Needling - 10/01/16 1102    Consent Given? Yes   Education Handout Provided No   Muscles Treated Upper Body Infraspinatus;Subscapularis;Pectoralis major  trap, all Rt side   Pectoralis Major Response Palpable increased muscle length;Twitch response elicited  Rt   Infraspinatus Response Palpable increased muscle length;Twitch response elicited   Subscapularis Response Twitch response elicited;Palpable increased muscle length                   PT Long Term Goals - 09/29/16 1059      PT LONG TERM GOAL #1   Title I with advanced HEP for Rt UE (10/14/16)    Status On-going     PT LONG TERM GOAL #2   Title demo Rt shoulder and elbow ROM WFL to allow her to reach head height shelves with good body mechanics ( 10/14/16)    Status Partially Met  elbow WNL     PT LONG TERM GOAL #3   Title improve Rt UE strength =/> 4+/5  to allow her to return to her prior household activities ( 10/14/16)    Status On-going     PT LONG TERM GOAL #4   Title report =/< 1/10 pain when reaching with her Rt UE to perform ADLs ( 10/14/16)    Status On-going     PT LONG TERM GOAL #5   Title improve FOTO =< 40% limited, CJ level, ( 10/14/16)    Status On-going               Plan - 10/01/16 1138    Clinical Impression Statement Moreen was very sore and fatigued in Rt shoulder after manual work today.  He continues to demonstrate compensatory motion in the Rt shoulder complex with overhead motions.  She is able to control her scapula better with she is below 90 degrees now.     Rehab Potential Good   PT Frequency 2x / week   PT Duration 6 weeks   PT Treatment/Interventions Moist Heat;Ultrasound;Therapeutic exercise;Dry needling;Taping;Vasopneumatic Device;Manual techniques;Neuromuscular re-education;Cryotherapy;Electrical  Stimulation;Iontophoresis 84m/ml Dexamethasone;Patient/family education;Passive range of motion   PT Next Visit Plan review new HEP, continue to improve shoudler ROM, scap stability.    Consulted and Agree with Plan of Care Patient      Patient will benefit from skilled therapeutic intervention in order to improve the following deficits and impairments:  Decreased range of motion, Impaired UE functional use, Increased muscle spasms, Pain, Decreased strength, Postural dysfunction, Improper body mechanics  Visit Diagnosis: Stiffness of right shoulder, not elsewhere classified  Acute pain of right shoulder  Muscle weakness (generalized)  Abnormal posture     Problem List Patient Active Problem List   Diagnosis Date Noted  . Palpitations 09/08/2016    SManuela Schwartzshaver PT  10/01/2016, 11:40 AM  [facility] [address]Phone: [phone]  Fax:  [phone] Name: Shelby VOGLERMRN: [id number] of Birth: [birth year]

## 2016-10-22 ENCOUNTER — Ambulatory Visit (INDEPENDENT_AMBULATORY_CARE_PROVIDER_SITE_OTHER): Payer: Medicare HMO | Admitting: Physical Therapy

## 2016-10-22 DIAGNOSIS — M6281 Muscle weakness (generalized): Secondary | ICD-10-CM

## 2016-10-22 DIAGNOSIS — M25611 Stiffness of right shoulder, not elsewhere classified: Secondary | ICD-10-CM

## 2016-10-22 DIAGNOSIS — R293 Abnormal posture: Secondary | ICD-10-CM | POA: Diagnosis not present

## 2016-10-22 DIAGNOSIS — M25511 Pain in right shoulder: Secondary | ICD-10-CM

## 2016-10-22 NOTE — Therapy (Signed)
[place], [place], [address] Phone: [phone]   Fax:  [phone]  Physical Therapy Treatment  Patient Details  Name: Shelby Gay MRN: [id number] Date of Birth: 10/22/1930 Referring Provider: Dr. Judeth Cornfield  Encounter Date: 10/22/2016      PT End of Session - 10/22/16 1024    Visit Number 9   Number of Visits 12   PT Start Time 6270   PT Stop Time 1111   PT Time Calculation (min) 53 min   Activity Tolerance Patient limited by pain      Past Medical History:  Diagnosis Date  . Atrial fibrillation ([place])   . Diabetes mellitus without complication ([place])   . Hypertension   . Thyroid disease     No past surgical history on file.  There were no vitals filed for this visit.      Subjective Assessment - 10/22/16 1024    Subjective Akirra returns after being away from therapy, visiting family from out of state.  she has been using pulley every day, achy afterward.  She states she has made an effort to use Right arm for functional tasks.    Patient Stated Goals she wishes to reach out and up, is very limited    Currently in Pain? No/denies   Pain Score --  up to 7/10 with pulleys at home.             [facility] PT Assessment - 10/22/16 0001      Assessment   Medical Diagnosis s/p ORIF proximal Rt humeral fx   Referring Provider Dr. Judeth Cornfield   Onset Date/Surgical Date 04/16/16   Hand Dominance Right   Next MD Visit PRN     AROM   Right/Left Shoulder Right   Right Shoulder Extension 41 Degrees   Right Shoulder Flexion 56 Degrees  standing   Right Shoulder ABduction 65 Degrees   Right Shoulder External Rotation 27 Degrees  supine, abdct 30 deg      PROM   Right Shoulder Flexion 124 Degrees  supine AAROM with cane   Right Shoulder External Rotation 40 Degrees          [facility] Adult PT Treatment/Exercise - 10/22/16 0001      Shoulder Exercises: Supine   Flexion  AAROM;10 reps  5 sec hold; then 1# lift to 90 deg for strength, x10   Other Supine Exercises scap retraction x 5 sec x 10 reps    Other Supine Exercises   AAROM with RUE to reach hand to forehead, with elbow press x 3 reps, held 30 sec .  Prolonged Rt abduct to tolerance for 30 sec x 2, snow angels to 80 deg abd with 10 sec hold x 10     Shoulder Exercises: Seated   Other Seated Exercises scap depression with Rt hand on green therapy ball x 5 sec, 10 reps; then scap depression with small circles CW/CCW, flex/ext, then rhythmic stabilization x 15-30 sec x 4 reps.      Shoulder Exercises: Pulleys   Flexion 3 minutes   ABduction 3 minutes     Moist Heat Therapy   Number Minutes Moist Heat 15 Minutes   Moist Heat Location Shoulder     Electrical Stimulation   Electrical Stimulation Location Rt shoulder    Electrical Stimulation Action IFC   Electrical Stimulation Parameters  to tolerance    Electrical Stimulation Goals Pain     Manual Therapy  Joint Mobilization Rt GH joint, inferior and posterior grade III   Soft tissue mobilization STM posterior Rt shoulder muscles and pecs   Myofascial Release Rt shoulder distraction, arm pull   Passive ROM Rt shoulder for flex, scaption, ER.                       PT Long Term Goals - 09/29/16 1059      PT LONG TERM GOAL #1   Title I with advanced HEP for Rt UE (10/14/16)    Status On-going     PT LONG TERM GOAL #2   Title demo Rt shoulder and elbow ROM WFL to allow her to reach head height shelves with good body mechanics ( 10/14/16)    Status Partially Met  elbow WNL     PT LONG TERM GOAL #3   Title improve Rt UE strength =/> 4+/5 to allow her to return to her prior household activities ( 10/14/16)    Status On-going     PT LONG TERM GOAL #4   Title report =/< 1/10 pain when reaching with her Rt UE to perform ADLs ( 10/14/16)    Status On-going     PT LONG TERM GOAL #5   Title improve FOTO =< 40% limited, CJ level, (  10/14/16)    Status On-going               Plan - 10/22/16 1326    Clinical Impression Statement Pt has been away from therapy for 3 wks due to family visiting from out of state.  Despite her efforts to continue her HEP, her Rt shoulder ROM has decreased 20 deg in most directions.  She tolerated treatment well, reporting mild increase in pain with stretches.  She is intersted in continuation of therapy .    Rehab Potential Good   PT Frequency 2x / week   PT Duration 6 weeks   PT Treatment/Interventions Moist Heat;Ultrasound;Therapeutic exercise;Dry needling;Taping;Vasopneumatic Device;Manual techniques;Neuromuscular re-education;Cryotherapy;Electrical Stimulation;Iontophoresis 18m/ml Dexamethasone;Patient/family education;Passive range of motion   PT Next Visit Plan spoke to supervising PT; will req additional visits from MD. cont to work on Rt shoulder ROM and postural strengthening.    Consulted and Agree with Plan of Care Patient      Patient will benefit from skilled therapeutic intervention in order to improve the following deficits and impairments:  Decreased range of motion, Impaired UE functional use, Increased muscle spasms, Pain, Decreased strength, Postural dysfunction, Improper body mechanics  Visit Diagnosis: Stiffness of right shoulder, not elsewhere classified  Acute pain of right shoulder  Muscle weakness (generalized)  Abnormal posture     Problem List Patient Active Problem List   Diagnosis Date Noted  . Palpitations 09/08/2016   JKerin Perna PTA 10/22/16 1:41 PM  [facility] [place] [address]Phone: [phone]  Fax:  [phone] Name: FDIMITRA WOODSTOCKMRN: [id number] of Birth: 923-Oct-1932

## 2016-10-27 ENCOUNTER — Ambulatory Visit (INDEPENDENT_AMBULATORY_CARE_PROVIDER_SITE_OTHER): Payer: Medicare HMO | Admitting: Physical Therapy

## 2016-10-27 DIAGNOSIS — M25511 Pain in right shoulder: Secondary | ICD-10-CM

## 2016-10-27 DIAGNOSIS — M6281 Muscle weakness (generalized): Secondary | ICD-10-CM | POA: Diagnosis not present

## 2016-10-27 DIAGNOSIS — M25611 Stiffness of right shoulder, not elsewhere classified: Secondary | ICD-10-CM | POA: Diagnosis not present

## 2016-10-27 DIAGNOSIS — R293 Abnormal posture: Secondary | ICD-10-CM

## 2016-10-27 NOTE — Therapy (Signed)
[place], [place], [address] Phone: [phone]   Fax:  [phone]  Physical Therapy Treatment  Patient Details  Name: Shelby Gay MRN: [id number] Date of Birth: 1930-03-22 Referring Provider: Dr. Judeth Cornfield  Encounter Date: 10/27/2016      PT End of Session - 10/27/16 1101    Visit Number 10   Number of Visits 18   Date for PT Re-Evaluation 11/24/16   PT Start Time 1016   PT Stop Time 1114   PT Time Calculation (min) 58 min   Activity Tolerance Patient tolerated treatment well   Behavior During Therapy [facility] for tasks assessed/performed      Past Medical History:  Diagnosis Date  . Atrial fibrillation ([place])   . Diabetes mellitus without complication ([place])   . Hypertension   . Thyroid disease     No past surgical history on file.  There were no vitals filed for this visit.      Subjective Assessment - 10/27/16 1021    Subjective Pt reports her Rt shoulder aches at night; she uses tylenol / biofreeze to easy pain. Otherwise, no new changes.    Currently in Pain? No/denies   Pain Score 0-No pain  only pain with use of arm above shoulder height, up to 3/10            [facility] PT Assessment - 10/27/16 0001      Assessment   Medical Diagnosis s/p ORIF proximal Rt humeral fx   Referring Provider Dr. Judeth Cornfield   Onset Date/Surgical Date 04/16/16   Hand Dominance Right     Observation/Other Assessments   Focus on Therapeutic Outcomes (FOTO)  58% limited      AROM   Right Shoulder Extension 45 Degrees   Right Shoulder Flexion 62 Degrees  standing, Tactile cues to avoid compensatory motions     PROM   Right/Left Shoulder Right   Right Shoulder Extension 36 Degrees  AAROM with cane   Right Shoulder Flexion 130 Degrees  supine AAROM with cane   Right Shoulder External Rotation 30 Degrees  supine, AAROM with cane          [facility] Adult PT Treatment/Exercise - 10/27/16  0001      Shoulder Exercises: Supine   Horizontal ABduction AAROM;12 reps  cane  (bench press motion, elbows out)   External Rotation AAROM;Right;10 reps  cane    Flexion AAROM;Right;12 reps  cane, VC for form   ABduction AAROM;Right;10 reps  with cane, to tolerance    Other Supine Exercises scap retraction/thoracic lift x 5 sec x 10 reps    Other Supine Exercises Elbow press with hands on forehead x 10 sec x 10 reps     Shoulder Exercises: Standing   Extension AAROM;Right;10 reps  cane    Other Standing Exercises wall ladder x 8 reps, up to #15, VC to keep shoulder from elevating     Shoulder Exercises: Stretch   Internal Rotation Stretch --  RUE, AAROM hand behind back; 10 reps      Modalities   Modalities Moist Heat;Electrical Stimulation     Moist Heat Therapy   Number Minutes Moist Heat 15 Minutes   Moist Heat Location Shoulder  Rt     Electrical Stimulation   Electrical Stimulation Location Rt shoulder    Electrical Stimulation Action IFC   Electrical Stimulation Parameters to tolerance    Electrical Stimulation Goals Pain     Manual Therapy  Soft tissue mobilization STM posterior Rt shoulder muscles and pecs   Myofascial Release Rt shoulder distraction, arm pull, gentle   Passive ROM Rt shoulder for flex, scaption, ER.    pt very guarded                 PT Education - Nov 20, 2016 1101    Education provided Yes   Education Details HEP - consolidated exercises with focus on ROM.    Person(s) Educated Patient   Methods Explanation;Demonstration;Verbal cues;Tactile cues;Handout   Comprehension Verbalized understanding;Returned demonstration             PT Long Term Goals - November 20, 2016 1155      PT LONG TERM GOAL #1   Title I with advanced HEP for Rt UE (11/24/16)    Time 4   Period Weeks   Status On-going     PT LONG TERM GOAL #2   Title demo Rt shoulder and elbow ROM WFL to allow her to reach head height shelves with good body mechanics (  11/24/16)    Time 4   Period Weeks   Status Partially Met     PT LONG TERM GOAL #3   Title improve Rt UE strength =/> 4+/5 to allow her to return to her prior household activities ( 11/24/16)    Time 4   Period Weeks   Status On-going     PT LONG TERM GOAL #4   Title report =/< 1/10 pain when reaching with her Rt UE to perform ADLs ( 11/24/16)    Time 4   Period Weeks   Status On-going     PT LONG TERM GOAL #5   Title improve FOTO =< 40% limited, CJ level, ( 11/24/16)    Time 4   Period Weeks   Status On-going  scored 58% limited, no change from initial                Plan - 11-20-2016 1206    Clinical Impression Statement Pt's ROM in Rt shoulder slowly improving after 3 wks away from therapy (due to family in town).  Re-issued HEP with ROM exercises.  Pt making gradual progress towards established goals. Pt will benefit from continued PT intervention to max functional mobility.  She utilizes compensatory patterns to elevate her UE. Requires VC to avoid these and strengthening of the shoulder girdle to help control this.    Rehab Potential Good   PT Frequency 2x / week   PT Duration 6 weeks   PT Treatment/Interventions Moist Heat;Ultrasound;Therapeutic exercise;Dry needling;Taping;Vasopneumatic Device;Manual techniques;Neuromuscular re-education;Cryotherapy;Electrical Stimulation;Iontophoresis 80m/ml Dexamethasone;Patient/family education;Passive range of motion   PT Next Visit Plan spoke to supervising PT; will req additional visits from MD. cont to work on Rt shoulder ROM and postural strengthening.    Consulted and Agree with Plan of Care Patient      Patient will benefit from skilled therapeutic intervention in order to improve the following deficits and impairments:  Decreased range of motion, Impaired UE functional use, Increased muscle spasms, Pain, Decreased strength, Postural dysfunction, Improper body mechanics  Visit Diagnosis: Stiffness of right shoulder, not  elsewhere classified  Acute pain of right shoulder  Muscle weakness (generalized)  Abnormal posture       [facility] PT PB G-CODES - [id number]    Functional Assessment Tool Used  Foto and professional judgement   Functional Limitations Carrying, moving and handling objects   Carrying, Moving and Handling Objects Current Status [phone] At least 40 percent but less  than 60 percent impaired, limited or restricted   Carrying, Moving and Handling Objects Goal Status [phone]) At least 20 percent but less than 40 percent impaired, limited or restricted      Problem List Patient Active Problem List   Diagnosis Date Noted  . Palpitations 09/08/2016   Kerin Perna, PTA 10/27/16 12:08 PM   Jeral Pinch, PT 10/27/16 12:14 PM   [facility] [place], [place], [address] Phone: [phone]   Fax:  [phone]  Name: Shelby Gay MRN: [id number] Date of Birth: Apr 08, 1930

## 2016-10-27 NOTE — Patient Instructions (Signed)
Cane Exercise: Flexion    Lie on back, holding cane above chest. Keeping arms as straight as possible, lower cane toward floor beyond head. Hold __2-3__ seconds. Repeat _10___ times. Do __1-2__ sessions per day.  SHOULDER: External Rotation - Supine (Cane)    Hold cane with both hands. Rotate arm away from body. Keep elbow on floor and next to body. _10__ reps per set, __1_ sets per day, _5__ days per week Add towel to keep elbow at side.  Elbow Press    Interlace fingers; bring hands ON FOREHEAD. Press elbows down. Hold _10__ seconds. Relax arms. Repeat __5-10_ times.  Cane Exercise: Abduction    Lying on back: Hold cane with right hand over end, palm-up, with other hand palm-down. Move arm out from side and up by pushing with other arm. Hold _2-3___ seconds. Repeat _10___ times. Do __1__ sessions per day. Cane Exercise: Extension    Stand holding cane behind back with both hands palm-up. Lift the cane away from body. Hold _2-3___ seconds. Repeat __10__ times. Do __1-2__ sessions per day.  Internal Rotation (Passive)    Bring hand behind back, using other hand to assist. Hold _5-10___ seconds. Repeat __10_ times. Do __1-2__ sessions per day.  Pendulum Circular    Bend forward 90 at waist, leaning on table for support. Rock body in a circular pattern to move arm clockwise __10__ times then counterclockwise __10__ times. Do _several___ sessions per day to keep limber.  Shoulder Blade Squeeze    Rotate shoulders back, then squeeze shoulder blades together. Repeat __10__ times. Do _2___ sessions per day.   [facility]patient Rehab at [facility] [place],  [address]  [phone] (office) [phone] (fax)

## 2016-10-29 ENCOUNTER — Encounter: Payer: Self-pay | Admitting: Physical Therapy

## 2016-10-29 ENCOUNTER — Ambulatory Visit (INDEPENDENT_AMBULATORY_CARE_PROVIDER_SITE_OTHER): Payer: Medicare HMO | Admitting: Physical Therapy

## 2016-10-29 DIAGNOSIS — M25511 Pain in right shoulder: Secondary | ICD-10-CM | POA: Diagnosis not present

## 2016-10-29 DIAGNOSIS — R293 Abnormal posture: Secondary | ICD-10-CM

## 2016-10-29 DIAGNOSIS — M6281 Muscle weakness (generalized): Secondary | ICD-10-CM

## 2016-10-29 DIAGNOSIS — M25611 Stiffness of right shoulder, not elsewhere classified: Secondary | ICD-10-CM

## 2016-10-29 NOTE — Therapy (Signed)
[place], [place], [address] Phone: [phone]   Fax:  [phone]  Physical Therapy Treatment  Patient Details  Name: Shelby Gay MRN: [id number] Date of Birth: Jun 29, 1930 Referring Provider: Dr. Judeth Cornfield  Encounter Date: 10/29/2016      PT End of Session - 10/29/16 1144    Visit Number 11   Number of Visits 18   Date for PT Re-Evaluation 11/24/16   PT Start Time 6503   PT Stop Time 5465   PT Time Calculation (min) 57 min      Past Medical History:  Diagnosis Date  . Atrial fibrillation ([place])   . Diabetes mellitus without complication ([place])   . Hypertension   . Thyroid disease     History reviewed. No pertinent surgical history.  There were no vitals filed for this visit.      Subjective Assessment - 10/29/16 1145    Subjective Pt reports her Rt arm was very sore last night used biofreeze and tylenol to settle it back down. Today it feels good   Patient Stated Goals she wishes to reach out and up, is very limited    Currently in Pain? No/denies  today only with certain motions            [facility] PT Assessment - 10/29/16 0001      Assessment   Medical Diagnosis s/p ORIF proximal Rt humeral fx     AROM   Right Shoulder Flexion 135 Degrees  supine   Right Shoulder External Rotation 47 Degrees  supine                     [facility] Adult PT Treatment/Exercise - 10/29/16 0001      Shoulder Exercises: Supine   Flexion Strengthening;Right;Weights  2x5   Shoulder Flexion Weight (lbs) 2     Shoulder Exercises: Sidelying   External Rotation Strengthening;Right;Weights  towel under arm   External Rotation Weight (lbs) 2     Shoulder Exercises: Pulleys   Flexion 3 minutes   ABduction 3 minutes     Shoulder Exercises: Stretch   Other Shoulder Stretches supine flexion stetch with strap around bilat wrists     Modalities   Modalities Moist  Heat;Electrical Stimulation     Moist Heat Therapy   Number Minutes Moist Heat 15 Minutes   Moist Heat Location Shoulder  Rt     Electrical Stimulation   Electrical Stimulation Location Rt shoulder   Electrical Stimulation Action IFC   Electrical Stimulation Parameters to tolerance   Electrical Stimulation Goals Pain     Manual Therapy   Manual Therapy Soft tissue mobilization;Joint mobilization;Passive ROM   Joint Mobilization Rt GH inferior mobs, grade III   Soft tissue mobilization STM to Rt pecs, posterior shoulder complex musculature   Myofascial Release Rt arm pull, able to move to ~95 degrees   Passive ROM Rt shoulder ER and Flex                     PT Long Term Goals - 10/27/16 1155      PT LONG TERM GOAL #1   Title I with advanced HEP for Rt UE (11/24/16)    Time 4   Period Weeks   Status On-going     PT LONG TERM GOAL #2   Title demo Rt shoulder and elbow ROM WFL to allow her to reach head height shelves with good body mechanics (  11/24/16)    Time 4   Period Weeks   Status Partially Met     PT LONG TERM GOAL #3   Title improve Rt UE strength =/> 4+/5 to allow her to return to her prior household activities ( 11/24/16)    Time 4   Period Weeks   Status On-going     PT LONG TERM GOAL #4   Title report =/< 1/10 pain when reaching with her Rt UE to perform ADLs ( 11/24/16)    Time 4   Period Weeks   Status On-going     PT LONG TERM GOAL #5   Title improve FOTO =< 40% limited, CJ level, ( 11/24/16)    Time 4   Period Weeks   Status On-going  scored 58% limited, no change from initial                Plan - 10/29/16 1234    Clinical Impression Statement Shelby Gay is demonstrated improved strength in her Rt shoulder and increased ROM, progress is slow and she does still use compensatory motion when in standing.  She reported she was able to use her Rt UE to wash her Lt axilla this AM.    Rehab Potential Good   PT Frequency 2x / week    PT Duration 4 weeks   PT Treatment/Interventions Moist Heat;Ultrasound;Therapeutic exercise;Dry needling;Taping;Vasopneumatic Device;Manual techniques;Neuromuscular re-education;Cryotherapy;Electrical Stimulation;Iontophoresis 66m/ml Dexamethasone;Patient/family education;Passive range of motion   PT Next Visit Plan Cont manual work to increased Rt shoulder ROM and strengthening in available ROM.    Consulted and Agree with Plan of Care Patient      Patient will benefit from skilled therapeutic intervention in order to improve the following deficits and impairments:  Decreased range of motion, Impaired UE functional use, Increased muscle spasms, Pain, Decreased strength, Postural dysfunction, Improper body mechanics  Visit Diagnosis: Stiffness of right shoulder, not elsewhere classified  Acute pain of right shoulder  Muscle weakness (generalized)  Abnormal posture     Problem List Patient Active Problem List   Diagnosis Date Noted  . Palpitations 09/08/2016    Shelby PinchPT  10/29/2016, 12:43 PM  [facility] [address]Phone: [phone]  Fax:  [phone] Name: Shelby BERKHEIMERMRN: [id number] of Birth: 905-03-1930

## 2016-11-03 ENCOUNTER — Encounter: Payer: Self-pay | Admitting: Physical Therapy

## 2016-11-03 ENCOUNTER — Ambulatory Visit (INDEPENDENT_AMBULATORY_CARE_PROVIDER_SITE_OTHER): Payer: Medicare HMO | Admitting: Physical Therapy

## 2016-11-03 DIAGNOSIS — R293 Abnormal posture: Secondary | ICD-10-CM

## 2016-11-03 DIAGNOSIS — M6281 Muscle weakness (generalized): Secondary | ICD-10-CM | POA: Diagnosis not present

## 2016-11-03 DIAGNOSIS — M25611 Stiffness of right shoulder, not elsewhere classified: Secondary | ICD-10-CM | POA: Diagnosis not present

## 2016-11-03 DIAGNOSIS — M25511 Pain in right shoulder: Secondary | ICD-10-CM | POA: Diagnosis not present

## 2016-11-03 NOTE — Therapy (Signed)
Shelby Gay  [place], [place], [address] Phone: [phone]   Fax:  [phone]  Physical Therapy Treatment  Patient Details  Name: Shelby Gay MRN: [id number] Date of Birth: [birth year] Referring Provider: Dr. Judeth Cornfield  Encounter Date: 11/03/2016      PT End of Session - 11/03/16 1017    Visit Number 12   Number of Visits 18   Date for PT Re-Evaluation 11/24/16   PT Start Time 1017   PT Stop Time 1118   PT Time Calculation (min) 61 min      Past Medical History:  Diagnosis Date  . Atrial fibrillation ([place])   . Diabetes mellitus without complication ([place])   . Hypertension   . Thyroid disease     History reviewed. No pertinent surgical history.  There were no vitals filed for this visit.      Subjective Assessment - 11/03/16 1020    Subjective Shelby Gay reports she is only have pain when she moves the arm.  This AM when she was moving in the bed she had a pain that shot down her arm to her wrist. It didn't last long and it was the first time she had that.    Patient Stated Goals she wishes to reach out and up, is very limited    Currently in Pain? --  4/10 with moving the Rt arm/pulleys.                          [place] Adult PT Treatment/Exercise - 11/03/16 0001      Shoulder Exercises: Supine   Flexion Strengthening;Right;20 reps;Weights   Shoulder Flexion Weight (lbs) 2   Other Supine Exercises Rt shoulder press up 2# x 20 reps,    Other Supine Exercises triceps x20 with other hand stabilizing elbow.      Shoulder Exercises: Prone   Retraction Strengthening;Right;Weights  arm off EOB, 2x10 reps, then wide row    Retraction Weight (lbs) 4  regular row, 2# wide row.      Shoulder Exercises: Standing   Other Standing Exercises wall pushups 2x10  wall ladder - up to # 18     Shoulder Exercises: Pulleys   Flexion 3 minutes   ABduction 3 minutes     Modalities   Modalities Moist Heat;Electrical Stimulation     Moist Heat Therapy   Number Minutes Moist Heat 15 Minutes   Moist Heat Location Shoulder     Electrical Stimulation   Electrical Stimulation Location Rt shoulder    Electrical Stimulation Action IFC   Electrical Stimulation Parameters to tolerance   Electrical Stimulation Goals Pain     Manual Therapy   Myofascial Release Rt arm pull, able to move to ~120 degrees   Passive ROM Rt shoulder ER and Flex                     PT Long Term Goals - 11/03/16 1022      PT LONG TERM GOAL #1   Title I with advanced HEP for Rt UE (11/24/16)    Status On-going     PT LONG TERM GOAL #2   Title demo Rt shoulder and elbow ROM WFL to allow her to reach head height shelves with good body mechanics ( 11/24/16)    Status Partially Met     PT LONG TERM GOAL #3   Title improve Rt UE strength =/> 4+/5 to allow  her to return to her prior household activities ( 11/24/16)    Status On-going     PT LONG TERM GOAL #4   Title report =/< 1/10 pain when reaching with her Rt UE to perform ADLs ( 11/24/16)    Status On-going     PT LONG TERM GOAL #5   Title improve FOTO =< 40% limited, CJ level, ( 11/24/16)    Status On-going               Plan - 11/03/16 1220    Clinical Impression Statement Pt has improved Rt shoulder ROM, able to move further before having pain,  starting to show a litte less compensatory motion, still requires some cues for this. Making progress to her goals.  Slow to improve strength.    Rehab Potential Good   PT Frequency 2x / week   PT Duration 4 weeks   PT Treatment/Interventions Moist Heat;Ultrasound;Therapeutic exercise;Dry needling;Taping;Vasopneumatic Device;Manual techniques;Neuromuscular re-education;Cryotherapy;Electrical Stimulation;Iontophoresis 5m/ml Dexamethasone;Patient/family education;Passive range of motion   PT Next Visit Plan Cont manual work to increased Rt shoulder ROM and strengthening in  available ROM.    Consulted and Agree with Plan of Care Patient      Patient will benefit from skilled therapeutic intervention in order to improve the following deficits and impairments:  Decreased range of motion, Impaired UE functional use, Increased muscle spasms, Pain, Decreased strength, Postural dysfunction, Improper body mechanics  Visit Diagnosis: Stiffness of right shoulder, not elsewhere classified  Acute pain of right shoulder  Muscle weakness (generalized)  Abnormal posture     Problem List Patient Active Problem List   Diagnosis Date Noted  . Palpitations 09/08/2016    Shelby Gay PT  11/03/2016, 12:22 PM  [facility] [address]Phone: [phone]  Fax:  [phone] Name: Shelby CORELLAMRN: [id number] of Birth: [birth year]

## 2016-11-05 ENCOUNTER — Encounter: Payer: Medicare HMO | Admitting: Physical Therapy

## 2016-11-10 ENCOUNTER — Ambulatory Visit (INDEPENDENT_AMBULATORY_CARE_PROVIDER_SITE_OTHER): Payer: Medicare HMO | Admitting: Physical Therapy

## 2016-11-10 DIAGNOSIS — R293 Abnormal posture: Secondary | ICD-10-CM

## 2016-11-10 DIAGNOSIS — M25511 Pain in right shoulder: Secondary | ICD-10-CM | POA: Diagnosis not present

## 2016-11-10 DIAGNOSIS — M6281 Muscle weakness (generalized): Secondary | ICD-10-CM | POA: Diagnosis not present

## 2016-11-10 DIAGNOSIS — M25611 Stiffness of right shoulder, not elsewhere classified: Secondary | ICD-10-CM | POA: Diagnosis not present

## 2016-11-10 DIAGNOSIS — N390 Urinary tract infection, site not specified: Secondary | ICD-10-CM | POA: Diagnosis not present

## 2016-11-10 NOTE — Therapy (Signed)
[place], [place], [address] Phone: [phone]   Fax:  [phone]  Physical Therapy Treatment  Patient Details  Name: Shelby Gay MRN: [id number] Date of Birth: Jan 15, 1930 Referring Provider: Dr. Judeth Cornfield  Encounter Date: 11/10/2016      PT End of Session - 11/10/16 1102    Visit Number 13   Number of Visits 18   Date for PT Re-Evaluation 11/24/16   PT Start Time 7628   PT Stop Time 1108   PT Time Calculation (min) 50 min   Activity Tolerance Patient limited by pain;Patient limited by fatigue   Behavior During Therapy [facility] for tasks assessed/performed      Past Medical History:  Diagnosis Date  . Atrial fibrillation ([place])   . Diabetes mellitus without complication ([place])   . Hypertension   . Thyroid disease     No past surgical history on file.  There were no vitals filed for this visit.      Subjective Assessment - 11/10/16 1021    Subjective Pt reports she has been under the weather with a cold and UTI.  She has not been sleeping well.  She has not been doing her exercises for last 4 days due to illness.  She was sore after last visit.    Currently in Pain? No/denies  up to 4-5/10 with pulleys   Pain Score 0-No pain            [facility] PT Assessment - 11/10/16 0001      Assessment   Medical Diagnosis s/p ORIF proximal Rt humeral fx   Referring Provider Dr. Judeth Cornfield     AROM   Right Shoulder Flexion 124 Degrees  supine, with AAROM   Right Shoulder External Rotation 32 Degrees  supine, scaption of 60 deg          [facility] Adult PT Treatment/Exercise - 11/10/16 0001      Elbow Exercises   Elbow Flexion Seated;Right;10 reps   Bar Weights/Barbell (Elbow Flexion) 2 lbs  eccentric control   Elbow Flexion Limitations then 10 reps with yellow band; VC to breathe.     Shoulder Exercises: Supine   Horizontal ABduction Strengthening;Both;10 reps;Theraband    Theraband Level (Shoulder Horizontal ABduction) Level 1 (Yellow)   Flexion Strengthening;5 reps;Weights  to 55 deg (tolerance) x 2 sets   Shoulder Flexion Weight (lbs) 1   Other Supine Exercises Rt shoulder press up 1# x 20 reps,      Shoulder Exercises: Standing   Other Standing Exercises wall ladder with Rt arm flexion x 6 reps, up to #16.       Shoulder Exercises: Pulleys   Flexion 3 minutes   Flexion Limitations VC to keep shoulder down   ABduction 3 minutes     Moist Heat Therapy   Number Minutes Moist Heat 15 Minutes   Moist Heat Location Shoulder     Electrical Stimulation   Electrical Stimulation Location Rt shoulder    Electrical Stimulation Action IFC   Electrical Stimulation Parameters to tolerance    Electrical Stimulation Goals Pain     Manual Therapy   Soft tissue mobilization STM to Rt pecs, posterior shoulder complex musculature   Passive ROM Rt arm traction with PROM into scaption, flexion;  PROM of horiz abd, ER/ IR.                  PT Education - 11/10/16 1035    Education Details  HEP - added L's/ W's.    Person(s) Educated Patient   Methods Explanation;Handout   Comprehension Verbalized understanding;Returned demonstration             PT Long Term Goals - 11/03/16 1022      PT LONG TERM GOAL #1   Title I with advanced HEP for Rt UE (11/24/16)    Status On-going     PT LONG TERM GOAL #2   Title demo Rt shoulder and elbow ROM WFL to allow her to reach head height shelves with good body mechanics ( 11/24/16)    Status Partially Met     PT LONG TERM GOAL #3   Title improve Rt UE strength =/> 4+/5 to allow her to return to her prior household activities ( 11/24/16)    Status On-going     PT LONG TERM GOAL #4   Title report =/< 1/10 pain when reaching with her Rt UE to perform ADLs ( 11/24/16)    Status On-going     PT LONG TERM GOAL #5   Title improve FOTO =< 40% limited, CJ level, ( 11/24/16)    Status On-going                Plan - 11/10/16 1026    Clinical Impression Statement Pt under the weather today; limited energy and increased pain with Rt shoulder exercises.  Decreased reps and weight, to pt tolerance, as well as included short rest breaks.  Pt's ROM decreased from last assessment.  Encouraged pt to complete ROM exercises at home to maintain gained range in therapy sessions.    Rehab Potential Good   PT Frequency 2x / week   PT Duration 4 weeks   PT Treatment/Interventions Moist Heat;Ultrasound;Therapeutic exercise;Dry needling;Taping;Vasopneumatic Device;Manual techniques;Neuromuscular re-education;Cryotherapy;Electrical Stimulation;Iontophoresis 89m/ml Dexamethasone;Patient/family education;Passive range of motion   PT Next Visit Plan Cont manual work to increased Rt shoulder ROM and strengthening in available ROM.    Consulted and Agree with Plan of Care Patient      Patient will benefit from skilled therapeutic intervention in order to improve the following deficits and impairments:  Decreased range of motion, Impaired UE functional use, Increased muscle spasms, Pain, Decreased strength, Postural dysfunction, Improper body mechanics  Visit Diagnosis: Stiffness of right shoulder, not elsewhere classified  Acute pain of right shoulder  Muscle weakness (generalized)  Abnormal posture     Problem List Patient Active Problem List   Diagnosis Date Noted  . Palpitations 09/08/2016   JKerin Perna PTA 11/10/16 1:22 PM  [facility] [address]Phone: [phone]  Fax:  [phone] Name: Shelby SPIRAMRN: [id number] of Birth: [birth year]

## 2016-11-10 NOTE — Patient Instructions (Signed)
Upper Back Strength: Lower Trapezius / Rotator Cuff " L's "     Arms in waitress pose, palms up. Press hands back and slide shoulder blades down. Hold for __5__ seconds. Repeat _10___ times. 1-2 times per day.    Scapular Retraction: Elbow Flexion (Standing)  "W's"     With elbows bent to 90, pinch shoulder blades together and rotate arms out, keeping elbows bent. Repeat __10__ times per set. Do __1-2__ sets per session. Do _several ___ sessions per day.   [facility]patient Rehab at [facility] [place], [place] [address]  [phone] (office) [phone] (fax)

## 2016-11-12 ENCOUNTER — Ambulatory Visit (INDEPENDENT_AMBULATORY_CARE_PROVIDER_SITE_OTHER): Payer: Medicare HMO | Admitting: Physical Therapy

## 2016-11-12 DIAGNOSIS — M25511 Pain in right shoulder: Secondary | ICD-10-CM | POA: Diagnosis not present

## 2016-11-12 DIAGNOSIS — R293 Abnormal posture: Secondary | ICD-10-CM | POA: Diagnosis not present

## 2016-11-12 DIAGNOSIS — M25611 Stiffness of right shoulder, not elsewhere classified: Secondary | ICD-10-CM

## 2016-11-12 DIAGNOSIS — M6281 Muscle weakness (generalized): Secondary | ICD-10-CM

## 2016-11-12 NOTE — Therapy (Signed)
[place], [place], [address] Phone: [phone]   Fax:  [phone]  Physical Therapy Treatment  Patient Details  Name: Shelby Gay MRN: [id number] Date of Birth: 1930/08/07 Referring Provider: Dr. Judeth Cornfield  Encounter Date: 11/12/2016      PT End of Session - 11/12/16 1034    Visit Number 14   Number of Visits 18   Date for PT Re-Evaluation 11/24/16   PT Start Time 1751   PT Stop Time 1110   PT Time Calculation (min) 55 min   Activity Tolerance Patient tolerated treatment well   Behavior During Therapy [facility] for tasks assessed/performed      Past Medical History:  Diagnosis Date  . Atrial fibrillation ([place])   . Diabetes mellitus without complication ([place])   . Hypertension   . Thyroid disease     No past surgical history on file.  There were no vitals filed for this visit.      Subjective Assessment - 11/12/16 1020    Subjective Shelby Gay reports she went to MD and is now on antibiotics for her UTI.  Medicine is making her nauseous so she stayed in bed yesterday, only did pulley for exercises 1x.    Currently in Pain? No/denies   Pain Score 0-No pain            [facility] PT Assessment - 11/12/16 0001      Assessment   Medical Diagnosis s/p ORIF proximal Rt humeral fx   Referring Provider Dr. Judeth Cornfield   Onset Date/Surgical Date 04/16/16   Hand Dominance Right   Next MD Visit PRN     PROM   Right Shoulder Extension 43 Degrees  standing   Right Shoulder Flexion --  75 deg in standing   Right Shoulder External Rotation 35 Degrees  AAROM with cane in supine                      [facility] Adult PT Treatment/Exercise - 11/12/16 0001      Shoulder Exercises: Supine   External Rotation AAROM;Right;10 reps  cane, 10 sec holds    Flexion Strengthening;5 reps;Weights  to 80 deg (tolerance) x 2 sets   Shoulder Flexion Weight (lbs) 2   Other Supine Exercises Rt  shoulder press up 2# x 10 reps, 2 sets      Shoulder Exercises: Sidelying   External Rotation Strengthening;Right;Weights;10 reps  towel under arm   External Rotation Weight (lbs) 1  2 sets     Shoulder Exercises: Standing   Extension AAROM;Right;10 reps  cane    Row Right;Theraband;15 reps   Theraband Level (Shoulder Row) Level 2 (Red)   Other Standing Exercises wall ladder with Rt arm flexion x 8 reps, up to #19.       Shoulder Exercises: Pulleys   Flexion 3 minutes   ABduction 3 minutes     Shoulder Exercises: ROM/Strengthening   UBE (Upper Arm Bike) L1x3.5' alt FWD/BWD, Lt UE helping Rt      Shoulder Exercises: Stretch   Other Shoulder Stretches wall wash with RUE, LUE assisting x 5 reps    Other Shoulder Stretches midlevel doorway stretch x 15 sec x 2 reps     Moist Heat Therapy   Number Minutes Moist Heat 15 Minutes   Moist Heat Location Shoulder  Rt      Electrical Stimulation   Electrical Stimulation Location Rt shoulder    Electrical Stimulation Action  IFC   Electrical Stimulation Parameters to tolerance    Electrical Stimulation Goals Pain     Manual Therapy   Passive ROM Rt arm traction with PROM into scaption, flexion;  PROM of horiz abd, ER/ IR.              PT Long Term Goals - 11/03/16 1022      PT LONG TERM GOAL #1   Title I with advanced HEP for Rt UE (11/24/16)    Status On-going     PT LONG TERM GOAL #2   Title demo Rt shoulder and elbow ROM WFL to allow her to reach head height shelves with good body mechanics ( 11/24/16)    Status Partially Met     PT LONG TERM GOAL #3   Title improve Rt UE strength =/> 4+/5 to allow her to return to her prior household activities ( 11/24/16)    Status On-going     PT LONG TERM GOAL #4   Title report =/< 1/10 pain when reaching with her Rt UE to perform ADLs ( 11/24/16)    Status On-going     PT LONG TERM GOAL #5   Title improve FOTO =< 40% limited, CJ level, ( 11/24/16)    Status On-going                Plan - 11/12/16 1034    Clinical Impression Statement Improved exercise tolerance.  Pt able to tolerate increased resistance with UE exercises and able to walk fingers up higher on wall ladder.  Pt remains motivated to progress towards established goals. She will benefit from continued PT intervention to max functional mobility.    Rehab Potential Good   PT Frequency 2x / week   PT Duration 4 weeks   PT Treatment/Interventions Moist Heat;Ultrasound;Therapeutic exercise;Dry needling;Taping;Vasopneumatic Device;Manual techniques;Neuromuscular re-education;Cryotherapy;Electrical Stimulation;Iontophoresis 63m/ml Dexamethasone;Patient/family education;Passive range of motion   PT Next Visit Plan Cont manual work to increased Rt shoulder ROM and strengthening in available ROM.    Consulted and Agree with Plan of Care Patient      Patient will benefit from skilled therapeutic intervention in order to improve the following deficits and impairments:  Decreased range of motion, Impaired UE functional use, Increased muscle spasms, Pain, Decreased strength, Postural dysfunction, Improper body mechanics  Visit Diagnosis: Stiffness of right shoulder, not elsewhere classified  Acute pain of right shoulder  Muscle weakness (generalized)  Abnormal posture     Problem List Patient Active Problem List   Diagnosis Date Noted  . Palpitations 09/08/2016   JKerin Perna PTA 11/12/16 10:57 AM  [facility]1[place] [address]Phone: [phone]  Fax:  [phone] Name: Shelby BRIMLEYMRN: [id number] of Birth: 9March 09, 1932

## 2016-11-16 ENCOUNTER — Ambulatory Visit: Payer: Medicare HMO | Admitting: Physical Therapy

## 2016-11-16 DIAGNOSIS — R293 Abnormal posture: Secondary | ICD-10-CM | POA: Diagnosis not present

## 2016-11-16 DIAGNOSIS — M25511 Pain in right shoulder: Secondary | ICD-10-CM

## 2016-11-16 DIAGNOSIS — M6281 Muscle weakness (generalized): Secondary | ICD-10-CM | POA: Diagnosis not present

## 2016-11-16 DIAGNOSIS — M25611 Stiffness of right shoulder, not elsewhere classified: Secondary | ICD-10-CM | POA: Diagnosis not present

## 2016-11-16 NOTE — Patient Instructions (Addendum)
Scapula Adduction With Pectorals, Low   Stand in doorframe with palms against frame and arms at 45. Lean forward and squeeze shoulder blades. Hold __15_ seconds. Repeat _3__ times per session. Do _1-2__ sessions per day.   [facility] Health Outpatient Rehab at [facility] [place], [place] [address]  [phone] (office) [phone] (fax)

## 2016-11-16 NOTE — Therapy (Signed)
[place], [place], [address] Phone: [phone]   Fax:  [phone]  Physical Therapy Treatment  Patient Details  Name: Shelby Gay MRN: [id number] Date of Birth: 1930-12-23 Referring Provider: Dr. Judeth Cornfield   Encounter Date: 11/16/2016  PT End of Session - 11/16/16 1107    Visit Number  15    Number of Visits  18    Date for PT Re-Evaluation  11/24/16    PT Start Time  1101    PT Stop Time  1204    PT Time Calculation (min)  63 min    Activity Tolerance  Patient tolerated treatment well    Behavior During Therapy  [facility] for tasks assessed/performed       Past Medical History:  Diagnosis Date  . Atrial fibrillation ([place])   . Diabetes mellitus without complication ([place])   . Hypertension   . Thyroid disease     No past surgical history on file.  There were no vitals filed for this visit.  Subjective Assessment - 11/16/16 1109    Subjective  Pt is hopeful she has returned to function in her Rt arm to what she had before illness 1 wk ago.     Currently in Pain?  No/denies    Pain Score  -- up to 3/10 with exercises, lifting   up to 3/10 with exercises, lifting   Pain Location  Shoulder    Pain Orientation  Right         [employer] PT Assessment - 11/16/16 0001      Assessment   Medical Diagnosis  s/p ORIF proximal Rt humeral fx    Referring Provider  Dr. Judeth Cornfield    Onset Date/Surgical Date  04/16/16    Hand Dominance  Right      AROM   Right Shoulder Extension  47 Degrees standing with cane   standing with cane   Right Shoulder Flexion  75 Degrees in standing   in standing   Right Shoulder ABduction  65 Degrees      PROM   PROM Assessment Site  Shoulder    Right/Left Shoulder  Right    Right Shoulder Flexion  123 Degrees supine with cane   supine with cane   Right Shoulder External Rotation  47 Degrees supine with cane   supine with cane       [facility] Adult PT  Treatment/Exercise - 11/16/16 0001      Elbow Exercises   Elbow Flexion  Seated;Right;10 reps    Bar Weights/Barbell (Elbow Flexion)  2 lbs eccentric control   eccentric control     Shoulder Exercises: Supine   External Rotation  AAROM;Right;10 reps cane, 10 sec holds    cane, 10 sec holds    Flexion  AAROM;Both;10 reps      Shoulder Exercises: Standing   Extension  AAROM;Right;10 reps cane    cane    Row  Right;Theraband;15 reps    Theraband Level (Shoulder Row)  Level 2 (Red) 2 sets   2 sets   Other Standing Exercises  wall ladder with Rt arm flexion x 3 reps, scaption x 3 reps. Rt shoulder flexion with 1# (with Active assistance from LUE) to back of truck x 12 reps      Shoulder Exercises: Pulleys   Flexion  3 minutes    ABduction  3 minutes      Shoulder Exercises: ROM/Strengthening   UBE (Upper Arm Bike)  L1x3.5' alt FWD/BWD, Lt UE helping Rt       Shoulder Exercises: Stretch   Other Shoulder Stretches  midlevel doorway stretch x 15 sec x 3 reps      Moist Heat Therapy   Number Minutes Moist Heat  15 Minutes    Moist Heat Location  Shoulder Rt    Rt      Electrical Stimulation   Electrical Stimulation Location  Rt shoulder    Electrical Stimulation Action  IFC    Electrical Stimulation Parameters  to tolerance     Electrical Stimulation Goals  Pain      Manual Therapy   Soft tissue mobilization  STM to Rt pecs, posterior shoulder complex musculature, as well as subscap and lat.     Passive ROM  Rt arm traction with PROM into scaption, flexion;  PROM of horiz abd, ER/ IR.                    PT Long Term Goals - 11/03/16 1022      PT LONG TERM GOAL #1   Title  I with advanced HEP for Rt UE (11/24/16)     Status  On-going      PT LONG TERM GOAL #2   Title  demo Rt shoulder and elbow ROM WFL to allow her to reach head height shelves with good body mechanics ( 11/24/16)     Status  Partially Met      PT LONG TERM GOAL #3   Title  improve Rt UE  strength =/> 4+/5 to allow her to return to her prior household activities ( 11/24/16)     Status  On-going      PT LONG TERM GOAL #4   Title  report =/< 1/10 pain when reaching with her Rt UE to perform ADLs ( 11/24/16)     Status  On-going      PT LONG TERM GOAL #5   Title  improve FOTO =< 40% limited, CJ level, ( 11/24/16)     Status  On-going            Plan - 11/16/16 1152    Clinical Impression Statement  Pt demonstrated improved Rt shoulder external rotation PROM; other motions unchanged from last visit.  Continues with weakness and decreased ROM in Rt shoulder abduction / flexion, along with scapular dyskinesis. Continue VC required for pt to avoid compensatory movements with RUE. Pt making gradual progress towards goals.     Rehab Potential  Good    PT Frequency  2x / week    PT Duration  4 weeks    PT Treatment/Interventions  Moist Heat;Ultrasound;Therapeutic exercise;Dry needling;Taping;Vasopneumatic Device;Manual techniques;Neuromuscular re-education;Cryotherapy;Electrical Stimulation;Iontophoresis 52m/ml Dexamethasone;Patient/family education;Passive range of motion    PT Next Visit Plan  Cont manual work to increased Rt shoulder ROM and strengthening in available ROM.     Consulted and Agree with Plan of Care  Patient       Patient will benefit from skilled therapeutic intervention in order to improve the following deficits and impairments:  Decreased range of motion, Impaired UE functional use, Increased muscle spasms, Pain, Decreased strength, Postural dysfunction, Improper body mechanics  Visit Diagnosis: Stiffness of right shoulder, not elsewhere classified  Acute pain of right shoulder  Muscle weakness (generalized)  Abnormal posture     Problem List Patient Active Problem List   Diagnosis Date Noted  . Palpitations 09/08/2016   JKerin Perna PTA 11/16/16 1:17 PM  [facility] [place], [place], [address] Phone: [phone]   Fax:  [phone]  Name: Shelby Gay MRN: [id number] Date of Birth: 1930-06-21

## 2016-11-19 ENCOUNTER — Encounter: Payer: Self-pay | Admitting: Physical Therapy

## 2016-11-19 ENCOUNTER — Ambulatory Visit (INDEPENDENT_AMBULATORY_CARE_PROVIDER_SITE_OTHER): Payer: Medicare HMO | Admitting: Physical Therapy

## 2016-11-19 DIAGNOSIS — M25511 Pain in right shoulder: Secondary | ICD-10-CM

## 2016-11-19 DIAGNOSIS — R293 Abnormal posture: Secondary | ICD-10-CM

## 2016-11-19 DIAGNOSIS — M25611 Stiffness of right shoulder, not elsewhere classified: Secondary | ICD-10-CM | POA: Diagnosis not present

## 2016-11-19 DIAGNOSIS — M6281 Muscle weakness (generalized): Secondary | ICD-10-CM | POA: Diagnosis not present

## 2016-11-19 NOTE — Therapy (Signed)
[place], [place], [address] Phone: [phone]   Fax:  [phone]  Physical Therapy Treatment  Patient Details  Name: Shelby Gay MRN: [id number] Date of Birth: 19-Nov-1930 Referring Provider: Dr. Judeth Cornfield   Encounter Date: 11/19/2016  PT End of Session - 11/19/16 1020    Visit Number  16    Number of Visits  18    Date for PT Re-Evaluation  11/24/16    PT Start Time  1020    PT Stop Time  1112    PT Time Calculation (min)  52 min    Activity Tolerance  Patient tolerated treatment well       Past Medical History:  Diagnosis Date  . Atrial fibrillation ([place])   . Diabetes mellitus without complication ([place])   . Hypertension   . Thyroid disease     History reviewed. No pertinent surgical history.  There were no vitals filed for this visit.  Subjective Assessment - 11/19/16 1023    Subjective  Pt reports she is feeling better health wise, able to sleep on the Rt side alittle now.     Patient Stated Goals  she wishes to reach out and up, is very limited     Currently in Pain?  No/denies         [facility] PT Assessment - 11/19/16 0001      Assessment   Medical Diagnosis  s/p ORIF proximal Rt humeral fx      AROM   Right Shoulder Flexion  -- supine, 115   supine, 115, active assist to 134   Right Shoulder ABduction  -- supine 104   supine 104                 [facility] Adult PT Treatment/Exercise - 11/19/16 0001      Self-Care   Other Self-Care Comments   reviewed and modified HEP       Shoulder Exercises: Supine   Flexion  Strengthening;Right;10 reps;Weights 2 sets   2 sets   Shoulder Flexion Weight (lbs)  2      Shoulder Exercises: Prone   Retraction  Strengthening;Right;Weights 3x10 rows with arm off EOB   3x10 rows with arm off EOB   Retraction Weight (lbs)  2      Shoulder Exercises: Pulleys   Flexion  3 minutes    ABduction  3 minutes      Shoulder  Exercises: Stretch   Other Shoulder Stretches  20 reps overh head stetch with strap around wrists.    Other Shoulder Stretches  10x10sec arms behind head insupine, elbow press, VC for form.       Modalities   Modalities  Moist Heat;Electrical Stimulation      Moist Heat Therapy   Number Minutes Moist Heat  15 Minutes    Moist Heat Location  Shoulder Rt    Rt      Electrical Stimulation   Electrical Stimulation Location  Rt shoulder    Electrical Stimulation Action  IFC    Electrical Stimulation Parameters  to tolerance    Electrical Stimulation Goals  Tone      Manual Therapy   Myofascial Release  Rt arm pull              PT Education - 11/19/16 1043    Education provided  Yes    Education Details  ROM HEP  modified, added in strengthening    Person(s) Educated  Patient    Methods  Explanation;Demonstration;Handout    Comprehension  Returned demonstration;Verbalized understanding          PT Long Term Goals - 11/19/16 1043      PT LONG TERM GOAL #1   Title  I with advanced HEP for Rt UE (11/24/16)     Status  On-going      PT LONG TERM GOAL #2   Title  demo Rt shoulder and elbow ROM WFL to allow her to reach head height shelves with good body mechanics ( 11/24/16)     Status  Partially Met      PT LONG TERM GOAL #3   Title  improve Rt UE strength =/> 4+/5 to allow her to return to her prior household activities ( 11/24/16)     Status  On-going      PT LONG TERM GOAL #4   Title  report =/< 1/10 pain when reaching with her Rt UE to perform ADLs ( 11/24/16)     Status  Achieved      PT LONG TERM GOAL #5   Title  improve FOTO =< 40% limited, CJ level, ( 11/24/16)     Status  Partially Met            Plan - 11/19/16 1043    Clinical Impression Statement  Jamaris ROM is back to what it was 2 weeks ago before she was sick and not able to perform her HEP. She still uses compensatory motion when lifting the Rt UE.  Strength is an issue as she has the ROM  in supine however not in an upright position.  She has met her pain goal.  Pain has not been much of an issue for her lately.  She is even able to sleep a little on the Rt side.     Rehab Potential  Good    PT Frequency  2x / week    PT Duration  4 weeks    PT Treatment/Interventions  Moist Heat;Ultrasound;Therapeutic exercise;Dry needling;Taping;Vasopneumatic Device;Manual techniques;Neuromuscular re-education;Cryotherapy;Electrical Stimulation;Iontophoresis 65m/ml Dexamethasone;Patient/family education;Passive range of motion    PT Next Visit Plan  focus on strengthening Rt shoulder complex. while maintaining good mechanics    Consulted and Agree with Plan of Care  Patient       Patient will benefit from skilled therapeutic intervention in order to improve the following deficits and impairments:  Decreased range of motion, Impaired UE functional use, Increased muscle spasms, Pain, Decreased strength, Postural dysfunction, Improper body mechanics  Visit Diagnosis: Stiffness of right shoulder, not elsewhere classified  Acute pain of right shoulder  Muscle weakness (generalized)  Abnormal posture     Problem List Patient Active Problem List   Diagnosis Date Noted  . Palpitations 09/08/2016    SJeral PinchPT  11/19/2016, [address] [address]Phone: [phone]  Fax:  [phone] Name: FDAWNE CASALIMRN: [id number] of Birth: 903/29/1932

## 2016-11-23 ENCOUNTER — Encounter: Payer: Medicare HMO | Admitting: Physical Therapy

## 2016-11-24 ENCOUNTER — Ambulatory Visit: Payer: Medicare HMO | Admitting: Physical Therapy

## 2016-11-24 ENCOUNTER — Encounter: Payer: Self-pay | Admitting: Physical Therapy

## 2016-11-24 DIAGNOSIS — M6281 Muscle weakness (generalized): Secondary | ICD-10-CM

## 2016-11-24 DIAGNOSIS — R293 Abnormal posture: Secondary | ICD-10-CM

## 2016-11-24 DIAGNOSIS — M25511 Pain in right shoulder: Secondary | ICD-10-CM

## 2016-11-24 DIAGNOSIS — M25611 Stiffness of right shoulder, not elsewhere classified: Secondary | ICD-10-CM | POA: Diagnosis not present

## 2016-11-24 NOTE — Therapy (Signed)
[place], [place], [address] Phone: [phone]   Fax:  [phone]  Physical Therapy Treatment  Patient Details  Name: Shelby Gay MRN: [id number] Date of Birth: 03/10/1930 Referring Provider: Dr. Judeth Cornfield   Encounter Date: 11/24/2016  PT End of Session - 11/24/16 1016    Visit Number  17    Number of Visits  18    Date for PT Re-Evaluation  11/24/16    PT Start Time  1016    PT Stop Time  1118    PT Time Calculation (min)  62 min       Past Medical History:  Diagnosis Date  . Atrial fibrillation ([place])   . Diabetes mellitus without complication ([place])   . Hypertension   . Thyroid disease     History reviewed. No pertinent surgical history.  There were no vitals filed for this visit.  Subjective Assessment - 11/24/16 1018    Subjective  Dub Mikes reports she isn't really having any soreness, doing her HEP  a lot more consistent.  She feels like she is going higher with her pulleys. Having trouble with prone rows because her bed is high, ( recommend she try on her couch )     Patient Stated Goals  she wishes to reach out and up, is very limited     Currently in Pain?  No/denies                      [facility] Adult PT Treatment/Exercise - 11/24/16 0001      Shoulder Exercises: Supine   Flexion  AAROM;Both with strap around wrists.       Shoulder Exercises: Standing   Flexion  Weights;Strengthening;Both;20 reps 2x10 FWD press    Shoulder Flexion Weight (lbs)  1    Other Standing Exercises  ball rolls on wall, CW/CCW - very difficult for pt.  3x10 triceps & biceps , green band    Other Standing Exercises  3x10 wall push ups      Shoulder Exercises: Pulleys   Flexion  -- 20 reps    ABduction  -- 20 reps      Shoulder Exercises: ROM/Strengthening   UBE (Upper Arm Bike)  L2x3' BWD only, VC for form      Modalities   Modalities  Moist Heat;Electrical Stimulation       Moist Heat Therapy   Number Minutes Moist Heat  15 Minutes    Moist Heat Location  Shoulder      Electrical Stimulation   Electrical Stimulation Location  Rt shoulder    Electrical Stimulation Action  IFC    Electrical Stimulation Parameters  tolerance    Electrical Stimulation Goals  Tone;Pain      Manual Therapy   Manual Therapy  Scapular mobilization    Soft tissue mobilization  TPR to Rt posterior Rt shoulder, Rt levator.     Myofascial Release  Rt arm pull, Rt pec release and lats.      Scapular Mobilization  scap protraction / retraction     Passive ROM  Rt arm traction with PROM into scaption, flexion;  PROM, ER/ IR.                    PT Long Term Goals - 11/19/16 1043      PT LONG TERM GOAL #1   Title  I with advanced HEP for Rt UE (11/24/16)  Status  On-going      PT LONG TERM GOAL #2   Title  demo Rt shoulder and elbow ROM WFL to allow her to reach head height shelves with good body mechanics ( 11/24/16)     Status  Partially Met      PT LONG TERM GOAL #3   Title  improve Rt UE strength =/> 4+/5 to allow her to return to her prior household activities ( 11/24/16)     Status  On-going      PT LONG TERM GOAL #4   Title  report =/< 1/10 pain when reaching with her Rt UE to perform ADLs ( 11/24/16)     Status  Achieved      PT LONG TERM GOAL #5   Title  improve FOTO =< 40% limited, CJ level, ( 11/24/16)     Status  Partially Met            Plan - 11/24/16 1017    Rehab Potential  Good    PT Frequency  2x / week    PT Duration  4 weeks    PT Treatment/Interventions  Moist Heat;Ultrasound;Therapeutic exercise;Dry needling;Taping;Vasopneumatic Device;Manual techniques;Neuromuscular re-education;Cryotherapy;Electrical Stimulation;Iontophoresis 76m/ml Dexamethasone;Patient/family education;Passive range of motion    PT Next Visit Plan  FOTO and reassess, if continues with therapy decrease frequency    Consulted and Agree with Plan of Care   Patient       Patient will benefit from skilled therapeutic intervention in order to improve the following deficits and impairments:  Decreased range of motion, Impaired UE functional use, Increased muscle spasms, Pain, Decreased strength, Postural dysfunction, Improper body mechanics  Visit Diagnosis: Stiffness of right shoulder, not elsewhere classified  Acute pain of right shoulder  Muscle weakness (generalized)  Abnormal posture     Problem List Patient Active Problem List   Diagnosis Date Noted  . Palpitations 09/08/2016    SJeral PinchPT  11/24/2016, 11:48 AM  [facility] [address]Phone: [phone]  Fax:  [phone] Name: FLAKITHA GORDYMRN: [id number] of Birth: [birth year]

## 2016-11-26 ENCOUNTER — Encounter: Payer: Medicare HMO | Admitting: Physical Therapy

## 2016-12-01 ENCOUNTER — Encounter: Payer: Self-pay | Admitting: Physical Therapy

## 2016-12-01 ENCOUNTER — Ambulatory Visit: Payer: Medicare HMO | Admitting: Physical Therapy

## 2016-12-01 DIAGNOSIS — M25511 Pain in right shoulder: Secondary | ICD-10-CM | POA: Diagnosis not present

## 2016-12-01 DIAGNOSIS — M6281 Muscle weakness (generalized): Secondary | ICD-10-CM

## 2016-12-01 DIAGNOSIS — M25611 Stiffness of right shoulder, not elsewhere classified: Secondary | ICD-10-CM

## 2016-12-01 DIAGNOSIS — R293 Abnormal posture: Secondary | ICD-10-CM | POA: Diagnosis not present

## 2016-12-01 NOTE — Therapy (Signed)
[place], [place], [address] Phone: [phone]   Fax:  [phone]  Physical Therapy Treatment  Patient Details  Name: GENESEE NASE MRN: [id number] Date of Birth: 04-07-1930 Referring Provider: Dr. Judeth Cornfield   Encounter Date: 12/01/2016  PT End of Session - 12/01/16 0936    Visit Number  18    Number of Visits  18    Date for PT Re-Evaluation  11/24/16    PT Start Time  0933    PT Stop Time  1030    PT Time Calculation (min)  57 min    Activity Tolerance  Patient tolerated treatment well    Behavior During Therapy  [facility] for tasks assessed/performed       Past Medical History:  Diagnosis Date  . Atrial fibrillation ([place])   . Diabetes mellitus without complication ([place])   . Hypertension   . Thyroid disease     History reviewed. No pertinent surgical history.  There were no vitals filed for this visit.  Subjective Assessment - 12/01/16 0936    Subjective  "I did all lot of pushing of things to get ready for holidays; I'm sore today". Davita reports she has been doing exercises every day.     Patient Stated Goals  she wishes to reach out and up, is very limited     Currently in Pain?  Yes    Pain Score  2     Pain Location  Shoulder    Pain Orientation  Right    Pain Descriptors / Indicators  Aching;Sore    Aggravating Factors   moving arm around    Pain Relieving Factors  heat, TENS.          [facility] PT Assessment - 12/01/16 0001      Assessment   Medical Diagnosis  s/p ORIF proximal Rt humeral fx    Referring Provider  Dr. Judeth Cornfield    Onset Date/Surgical Date  04/16/16    Hand Dominance  Right    Next MD Visit  PRN      AROM   Right Shoulder Extension  50 Degrees standing    Right Shoulder Flexion  70 Degrees standing, 115 deg in supine    Right Shoulder ABduction  73 Degrees standing    Right Shoulder External Rotation  35 Degrees supine, scaption of 60 deg      Strength   Right Shoulder Flexion  3+/5 within available range    Right Shoulder ABduction  3+/5 within available range    Right Shoulder Internal Rotation  4+/5    Right Shoulder External Rotation  3-/5        [facility] Adult PT Treatment/Exercise - 12/01/16 0001      Elbow Exercises   Elbow Flexion  Seated;Right;10 reps    Bar Weights/Barbell (Elbow Flexion)  2 lbs;3 lbs only 5 reps with 3#    Elbow Extension  Right;20 reps;Seated;Theraband red band x 5, rest with Green      Shoulder Exercises: Supine   Flexion  AAROM;Both with strap around wrists.       Shoulder Exercises: Pulleys   Flexion  -- 20 reps    Flexion Limitations  VC to keep shoulder down    ABduction  -- 20 reps      Shoulder Exercises: ROM/Strengthening   UBE (Upper Arm Bike)  L1 x 3.26mn: alternating directions      Shoulder Exercises: Stretch   Other Shoulder  Stretches  low and midlevel doorway stretch x 20 sec x 2 reps each       Moist Heat Therapy   Number Minutes Moist Heat  15 Minutes    Moist Heat Location  Shoulder      Electrical Stimulation   Electrical Stimulation Location  Rt shoulder    Electrical Stimulation Action  IFC    Electrical Stimulation Parameters  to tolerance    Electrical Stimulation Goals  Tone;Pain      Manual Therapy   Myofascial Release  Rt arm pull, Rt pec release and lats.      Passive ROM  Rt arm traction with PROM into scaption, flexion;  PROM, ER/ IR.               PT Education - 12/01/16 0946    Education provided  Yes    Education Details  HEP    Person(s) Educated  Patient    Methods  Explanation;Handout;Verbal cues;Demonstration    Comprehension  Verbalized understanding;Returned demonstration          PT Long Term Goals - 12/01/16 0959      PT LONG TERM GOAL #1   Title  I with advanced HEP for Rt UE (11/24/16)     Time  4    Period  Weeks    Status  Achieved      PT LONG TERM GOAL #2   Title  demo Rt shoulder and elbow ROM WFL to allow her to  reach head height shelves with good body mechanics ( 11/24/16)     Time  4    Period  Weeks    Status  Partially Met      PT LONG TERM GOAL #3   Title  improve Rt UE strength =/> 4+/5 to allow her to return to her prior household activities ( 11/24/16)     Time  4    Period  Weeks    Status  Partially Met      PT LONG TERM GOAL #4   Title  report =/< 1/10 pain when reaching with her Rt UE to perform ADLs ( 11/24/16)     Time  4    Period  Weeks    Status  Achieved      PT LONG TERM GOAL #5   Title  improve FOTO =< 40% limited, CJ level, ( 11/24/16)     Time  4    Period  Weeks    Status  Not Met 45% limited            Plan - 12/01/16 1025    Clinical Impression Statement  Pt continues with limited Rt shoulder ROM and strength.  She had slight increase in pain with exercise today; reduced with use of estim/MHP.  She has partially met her goals and requests to d/c to HEP at this time.     Rehab Potential  Good    PT Frequency  2x / week    PT Duration  4 weeks    PT Treatment/Interventions  Moist Heat;Ultrasound;Therapeutic exercise;Dry needling;Taping;Vasopneumatic Device;Manual techniques;Neuromuscular re-education;Cryotherapy;Electrical Stimulation;Iontophoresis 89m/ml Dexamethasone;Patient/family education;Passive range of motion    PT Next Visit Plan  Spoke to supervising PT; will d/c per pt request.     Consulted and Agree with Plan of Care  Patient       Patient will benefit from skilled therapeutic intervention in order to improve the following deficits and impairments:  Decreased range of motion, Impaired UE functional  use, Increased muscle spasms, Pain, Decreased strength, Postural dysfunction, Improper body mechanics  Visit Diagnosis: Stiffness of right shoulder, not elsewhere classified  Acute pain of right shoulder  Muscle weakness (generalized)  Abnormal posture     Problem List Patient Active Problem List   Diagnosis Date Noted  . Palpitations  09/08/2016   Kerin Perna, PTA 12/01/16 11:30 AM  [place], [place], [address] Phone: [phone]   Fax:  [phone]  Name: MATTEA SEGER MRN: [id number] Date of Birth: 1930/04/15  PHYSICAL THERAPY DISCHARGE SUMMARY  Visits from Start of Care: 18  Current functional level related to goals / functional outcomes: See progress note for discharge status   Remaining deficits: See note    Education / Equipment: HEP  Plan: Patient agrees to discharge.  Patient goals were partially met. Patient is being discharged due to being pleased with the current functional level.  ?????    Celyn P. Helene Kelp PT, MPH 12/01/16 12:46 PM

## 2016-12-01 NOTE — Patient Instructions (Signed)
Wall Push-Up: Double Arm    Stand _2-3_ feet from wall with both hands on wall. Perform a push-up. Repeat _10__ times per set. Rest __2_ min after set. Do _2__ sets per session.   Reverse wall push up with elbows pushing into wall x 10 reps, 2 sets.    [facility] [place],  [address]  [phone] (office) [phone] (fax)

## 2016-12-08 ENCOUNTER — Encounter: Payer: Medicare HMO | Admitting: Physical Therapy

## 2016-12-10 ENCOUNTER — Encounter: Payer: Medicare HMO | Admitting: Physical Therapy

## 2017-01-08 DIAGNOSIS — J208 Acute bronchitis due to other specified organisms: Secondary | ICD-10-CM | POA: Diagnosis not present

## 2017-02-03 DIAGNOSIS — D509 Iron deficiency anemia, unspecified: Secondary | ICD-10-CM | POA: Diagnosis not present

## 2017-02-03 DIAGNOSIS — Z0000 Encounter for general adult medical examination without abnormal findings: Secondary | ICD-10-CM | POA: Diagnosis not present

## 2017-02-03 DIAGNOSIS — E7800 Pure hypercholesterolemia, unspecified: Secondary | ICD-10-CM | POA: Diagnosis not present

## 2017-02-03 DIAGNOSIS — M25511 Pain in right shoulder: Secondary | ICD-10-CM | POA: Diagnosis not present

## 2017-02-03 DIAGNOSIS — E1122 Type 2 diabetes mellitus with diabetic chronic kidney disease: Secondary | ICD-10-CM | POA: Diagnosis not present

## 2017-02-03 DIAGNOSIS — I129 Hypertensive chronic kidney disease with stage 1 through stage 4 chronic kidney disease, or unspecified chronic kidney disease: Secondary | ICD-10-CM | POA: Diagnosis not present

## 2017-02-03 DIAGNOSIS — I471 Supraventricular tachycardia: Secondary | ICD-10-CM | POA: Diagnosis not present

## 2017-02-03 DIAGNOSIS — E039 Hypothyroidism, unspecified: Secondary | ICD-10-CM | POA: Diagnosis not present

## 2017-02-03 DIAGNOSIS — Z23 Encounter for immunization: Secondary | ICD-10-CM | POA: Diagnosis not present

## 2017-03-01 DIAGNOSIS — N184 Chronic kidney disease, stage 4 (severe): Secondary | ICD-10-CM | POA: Diagnosis not present

## 2017-03-01 DIAGNOSIS — I350 Nonrheumatic aortic (valve) stenosis: Secondary | ICD-10-CM | POA: Diagnosis not present

## 2017-03-01 DIAGNOSIS — E7800 Pure hypercholesterolemia, unspecified: Secondary | ICD-10-CM | POA: Diagnosis not present

## 2017-03-01 DIAGNOSIS — E1122 Type 2 diabetes mellitus with diabetic chronic kidney disease: Secondary | ICD-10-CM | POA: Diagnosis not present

## 2017-03-01 DIAGNOSIS — I129 Hypertensive chronic kidney disease with stage 1 through stage 4 chronic kidney disease, or unspecified chronic kidney disease: Secondary | ICD-10-CM | POA: Diagnosis not present

## 2017-03-02 DIAGNOSIS — H401131 Primary open-angle glaucoma, bilateral, mild stage: Secondary | ICD-10-CM | POA: Diagnosis not present

## 2017-03-02 DIAGNOSIS — H04123 Dry eye syndrome of bilateral lacrimal glands: Secondary | ICD-10-CM | POA: Diagnosis not present

## 2017-03-02 DIAGNOSIS — E119 Type 2 diabetes mellitus without complications: Secondary | ICD-10-CM | POA: Diagnosis not present

## 2017-03-02 DIAGNOSIS — H26492 Other secondary cataract, left eye: Secondary | ICD-10-CM | POA: Diagnosis not present

## 2017-03-02 DIAGNOSIS — H524 Presbyopia: Secondary | ICD-10-CM | POA: Diagnosis not present

## 2017-03-30 DIAGNOSIS — M8588 Other specified disorders of bone density and structure, other site: Secondary | ICD-10-CM | POA: Diagnosis not present

## 2017-03-30 DIAGNOSIS — E2839 Other primary ovarian failure: Secondary | ICD-10-CM | POA: Diagnosis not present

## 2017-04-12 DIAGNOSIS — M19011 Primary osteoarthritis, right shoulder: Secondary | ICD-10-CM | POA: Diagnosis not present

## 2017-04-12 DIAGNOSIS — G8929 Other chronic pain: Secondary | ICD-10-CM | POA: Diagnosis not present

## 2017-04-12 DIAGNOSIS — M25511 Pain in right shoulder: Secondary | ICD-10-CM | POA: Diagnosis not present

## 2017-04-22 DIAGNOSIS — I480 Paroxysmal atrial fibrillation: Secondary | ICD-10-CM | POA: Diagnosis not present

## 2017-04-22 DIAGNOSIS — I10 Essential (primary) hypertension: Secondary | ICD-10-CM | POA: Diagnosis not present

## 2017-04-22 DIAGNOSIS — I2510 Atherosclerotic heart disease of native coronary artery without angina pectoris: Secondary | ICD-10-CM | POA: Diagnosis not present

## 2017-04-22 DIAGNOSIS — E119 Type 2 diabetes mellitus without complications: Secondary | ICD-10-CM | POA: Diagnosis not present

## 2017-04-22 DIAGNOSIS — D62 Acute posthemorrhagic anemia: Secondary | ICD-10-CM | POA: Diagnosis not present

## 2017-04-22 DIAGNOSIS — I350 Nonrheumatic aortic (valve) stenosis: Secondary | ICD-10-CM | POA: Diagnosis not present

## 2017-06-04 IMAGING — CR DG WRIST COMPLETE 3+V*L*
4 series · 4 of 4 positions shown · non-contrast
Comparison: July 06, 2012

CLINICAL DATA: Two week history of pain. No history of recent
trauma.

EXAM:
LEFT WRIST - COMPLETE 3+ VIEW

[x wrist pa left]
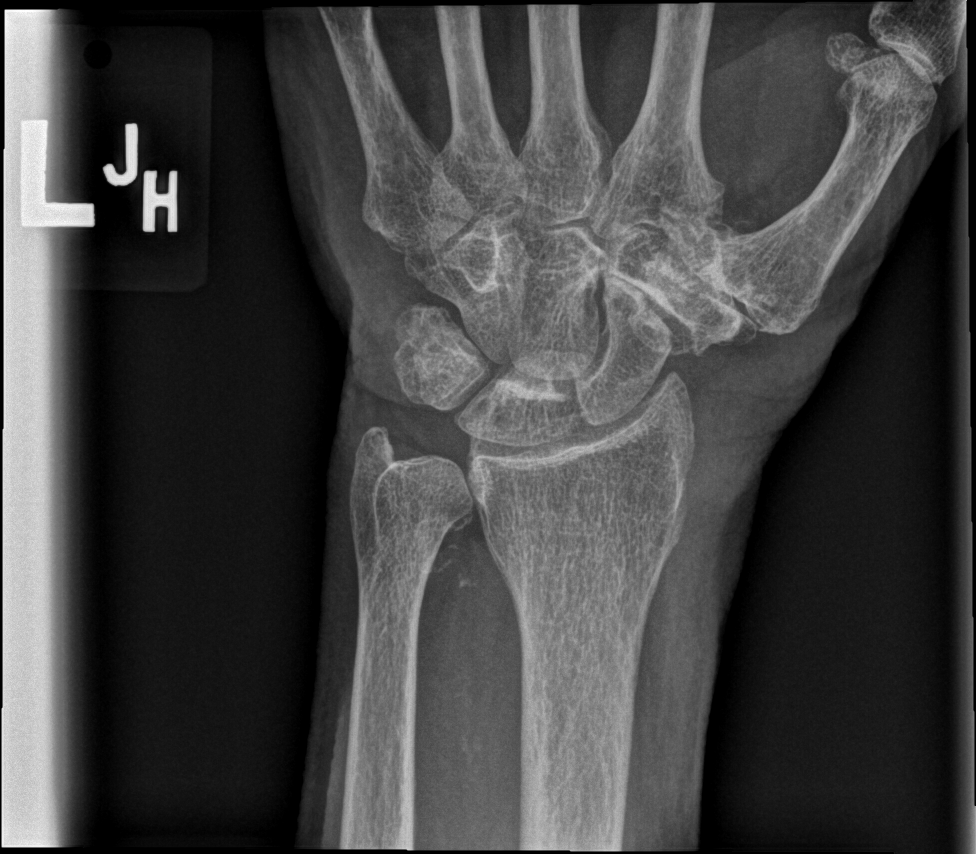

[x wrist navicular view left]
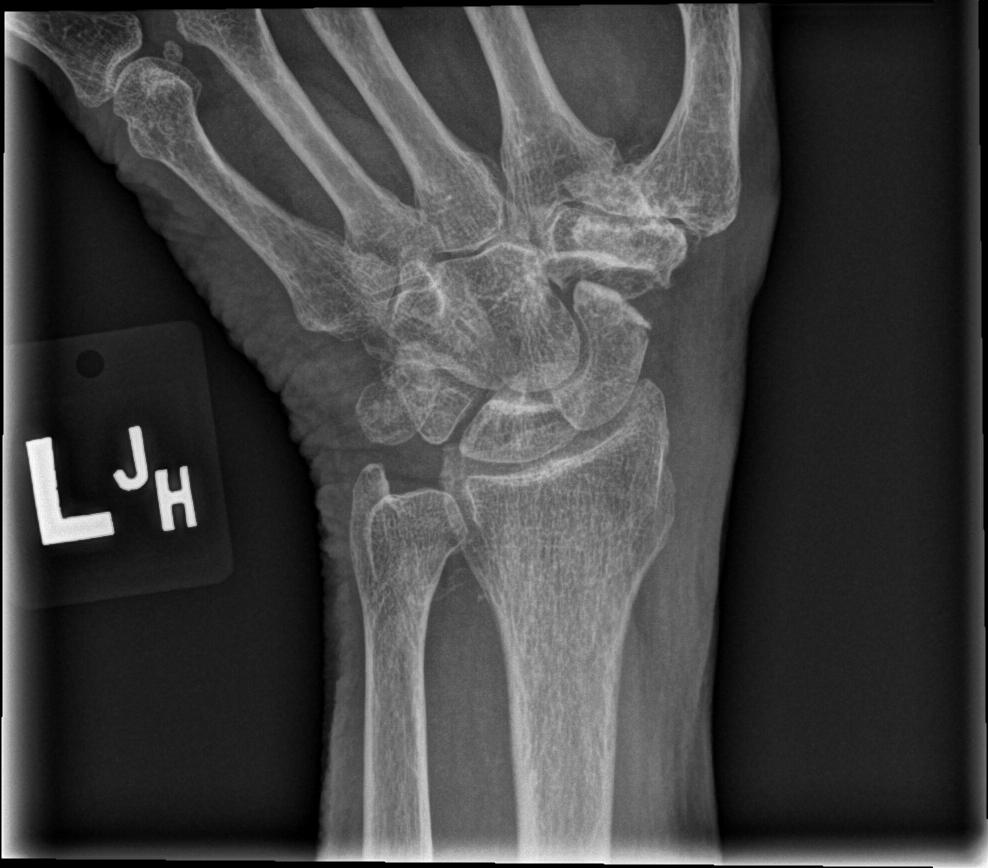

[x wrist obl left]
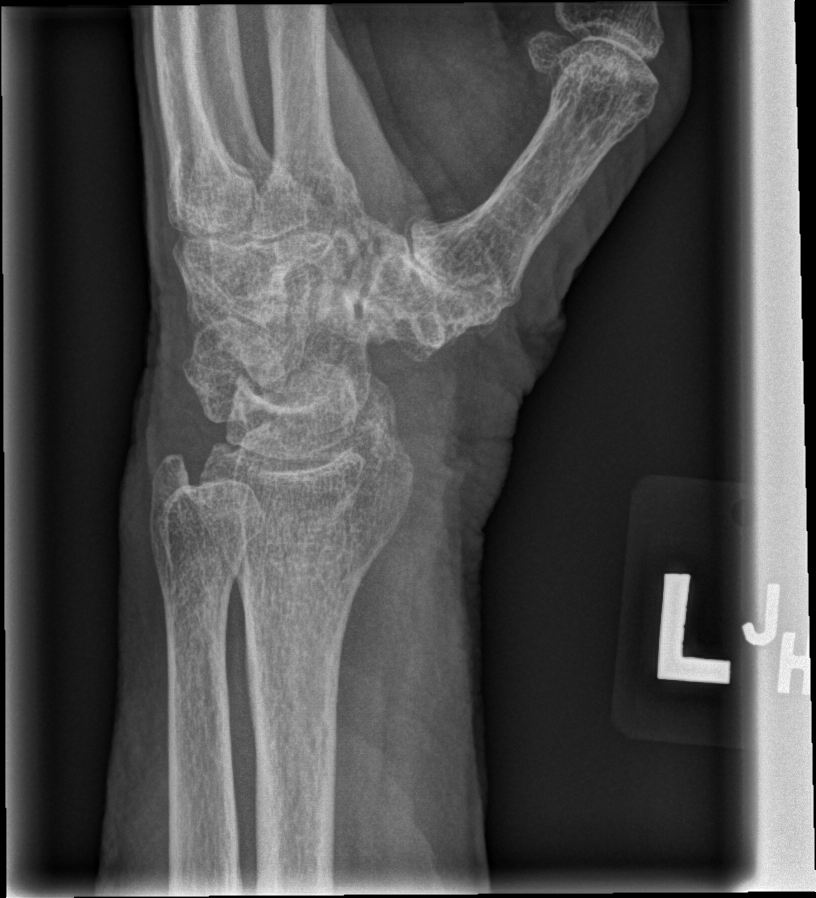

[x wrist lat left]
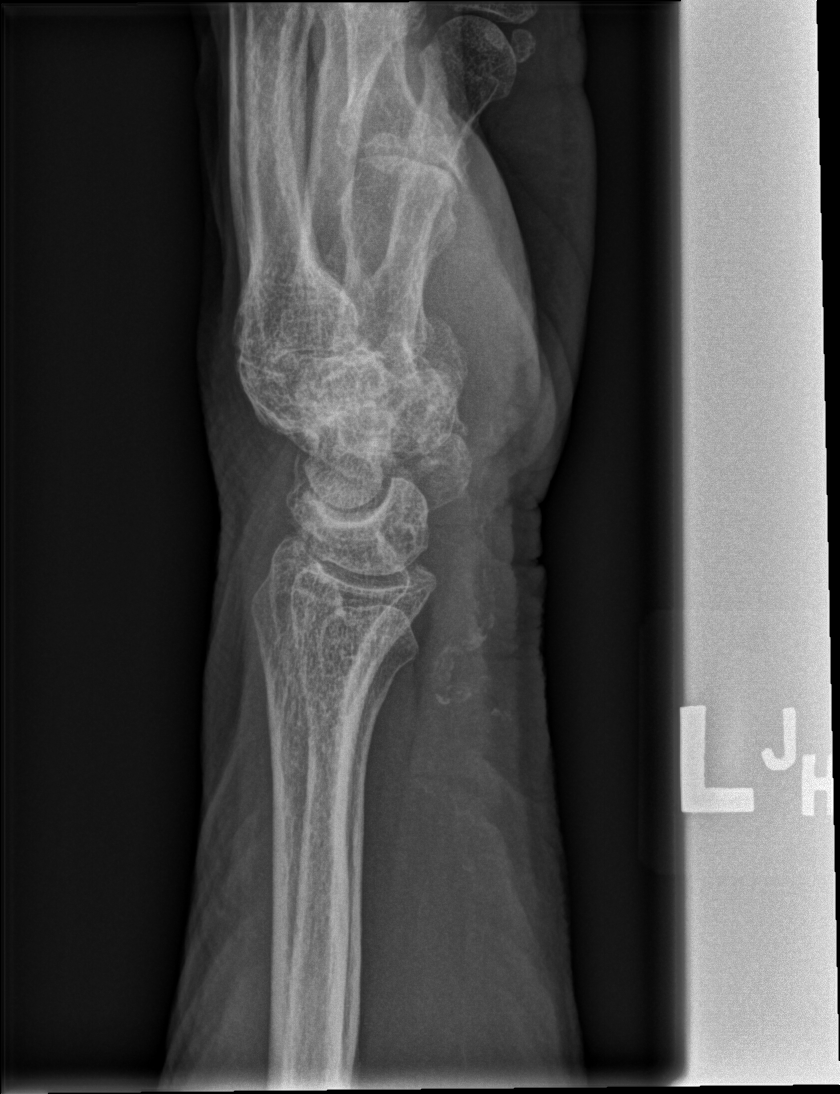

[4 of 4 positions shown; findings below may reference images not displayed]

FINDINGS: Frontal, oblique, lateral, and ulnar deviation scaphoid images were
obtained. There is no demonstrable fracture or dislocation. There is
extensive osteoarthritic change in the scaphotrapezial and first
carpal -metacarpal joints with remodeling of the trapezium bone. No
erosive change. There are multiple foci of arterial vascular
calcification.
IMPRESSION: Chronic osteoarthritic change in the first carpal -metacarpal and
scaphotrapezial joints with chronic remodeling of the trapezium.
These changes were also present on prior study. No acute fracture or
dislocation. Multiple foci of arterial vascular calcification
present.

## 2017-06-14 DIAGNOSIS — H1131 Conjunctival hemorrhage, right eye: Secondary | ICD-10-CM | POA: Diagnosis not present

## 2017-06-21 DIAGNOSIS — Z1231 Encounter for screening mammogram for malignant neoplasm of breast: Secondary | ICD-10-CM | POA: Diagnosis not present

## 2017-10-20 DIAGNOSIS — H401131 Primary open-angle glaucoma, bilateral, mild stage: Secondary | ICD-10-CM | POA: Diagnosis not present

## 2017-10-26 DIAGNOSIS — H401131 Primary open-angle glaucoma, bilateral, mild stage: Secondary | ICD-10-CM | POA: Diagnosis not present

## 2017-11-01 DIAGNOSIS — I350 Nonrheumatic aortic (valve) stenosis: Secondary | ICD-10-CM | POA: Diagnosis not present

## 2017-11-01 DIAGNOSIS — E119 Type 2 diabetes mellitus without complications: Secondary | ICD-10-CM | POA: Diagnosis not present

## 2017-11-01 DIAGNOSIS — I2510 Atherosclerotic heart disease of native coronary artery without angina pectoris: Secondary | ICD-10-CM | POA: Diagnosis not present

## 2017-11-01 DIAGNOSIS — I10 Essential (primary) hypertension: Secondary | ICD-10-CM | POA: Diagnosis not present

## 2017-11-01 DIAGNOSIS — I480 Paroxysmal atrial fibrillation: Secondary | ICD-10-CM | POA: Diagnosis not present

## 2017-11-01 DIAGNOSIS — D62 Acute posthemorrhagic anemia: Secondary | ICD-10-CM | POA: Diagnosis not present

## 2017-11-10 DIAGNOSIS — I350 Nonrheumatic aortic (valve) stenosis: Secondary | ICD-10-CM | POA: Diagnosis not present

## 2017-11-10 DIAGNOSIS — I083 Combined rheumatic disorders of mitral, aortic and tricuspid valves: Secondary | ICD-10-CM | POA: Diagnosis not present

## 2017-11-10 DIAGNOSIS — I480 Paroxysmal atrial fibrillation: Secondary | ICD-10-CM | POA: Diagnosis not present

## 2017-12-21 DIAGNOSIS — M19011 Primary osteoarthritis, right shoulder: Secondary | ICD-10-CM | POA: Diagnosis not present

## 2017-12-30 DIAGNOSIS — T85848A Pain due to other internal prosthetic devices, implants and grafts, initial encounter: Secondary | ICD-10-CM | POA: Diagnosis not present

## 2017-12-30 DIAGNOSIS — M19171 Post-traumatic osteoarthritis, right ankle and foot: Secondary | ICD-10-CM | POA: Diagnosis not present

## 2017-12-30 DIAGNOSIS — M19071 Primary osteoarthritis, right ankle and foot: Secondary | ICD-10-CM | POA: Diagnosis not present

## 2018-02-07 DIAGNOSIS — N390 Urinary tract infection, site not specified: Secondary | ICD-10-CM | POA: Diagnosis not present

## 2018-02-07 DIAGNOSIS — I471 Supraventricular tachycardia: Secondary | ICD-10-CM | POA: Diagnosis not present

## 2018-02-07 DIAGNOSIS — I129 Hypertensive chronic kidney disease with stage 1 through stage 4 chronic kidney disease, or unspecified chronic kidney disease: Secondary | ICD-10-CM | POA: Diagnosis not present

## 2018-02-07 DIAGNOSIS — D692 Other nonthrombocytopenic purpura: Secondary | ICD-10-CM | POA: Diagnosis not present

## 2018-02-07 DIAGNOSIS — Z23 Encounter for immunization: Secondary | ICD-10-CM | POA: Diagnosis not present

## 2018-02-07 DIAGNOSIS — E7800 Pure hypercholesterolemia, unspecified: Secondary | ICD-10-CM | POA: Diagnosis not present

## 2018-02-07 DIAGNOSIS — Z0000 Encounter for general adult medical examination without abnormal findings: Secondary | ICD-10-CM | POA: Diagnosis not present

## 2018-02-07 DIAGNOSIS — E039 Hypothyroidism, unspecified: Secondary | ICD-10-CM | POA: Diagnosis not present

## 2018-02-07 DIAGNOSIS — N183 Chronic kidney disease, stage 3 (moderate): Secondary | ICD-10-CM | POA: Diagnosis not present

## 2018-02-07 DIAGNOSIS — E1122 Type 2 diabetes mellitus with diabetic chronic kidney disease: Secondary | ICD-10-CM | POA: Diagnosis not present

## 2018-03-16 DIAGNOSIS — G44209 Tension-type headache, unspecified, not intractable: Secondary | ICD-10-CM | POA: Diagnosis not present

## 2018-05-20 DIAGNOSIS — M62838 Other muscle spasm: Secondary | ICD-10-CM | POA: Diagnosis not present

## 2018-05-28 DIAGNOSIS — R7989 Other specified abnormal findings of blood chemistry: Secondary | ICD-10-CM | POA: Diagnosis not present

## 2018-05-28 DIAGNOSIS — Z7901 Long term (current) use of anticoagulants: Secondary | ICD-10-CM | POA: Diagnosis not present

## 2018-05-28 DIAGNOSIS — R531 Weakness: Secondary | ICD-10-CM | POA: Diagnosis not present

## 2018-05-28 DIAGNOSIS — R42 Dizziness and giddiness: Secondary | ICD-10-CM | POA: Diagnosis not present

## 2018-05-28 DIAGNOSIS — R9431 Abnormal electrocardiogram [ECG] [EKG]: Secondary | ICD-10-CM | POA: Diagnosis not present

## 2018-05-28 DIAGNOSIS — R5383 Other fatigue: Secondary | ICD-10-CM | POA: Diagnosis not present

## 2018-05-28 DIAGNOSIS — I471 Supraventricular tachycardia: Secondary | ICD-10-CM | POA: Diagnosis not present

## 2018-05-28 DIAGNOSIS — I480 Paroxysmal atrial fibrillation: Secondary | ICD-10-CM | POA: Diagnosis not present

## 2018-05-28 DIAGNOSIS — E1122 Type 2 diabetes mellitus with diabetic chronic kidney disease: Secondary | ICD-10-CM | POA: Diagnosis not present

## 2018-05-28 DIAGNOSIS — I214 Non-ST elevation (NSTEMI) myocardial infarction: Secondary | ICD-10-CM | POA: Diagnosis not present

## 2018-05-28 DIAGNOSIS — R0602 Shortness of breath: Secondary | ICD-10-CM | POA: Diagnosis not present

## 2018-05-28 DIAGNOSIS — Z6828 Body mass index (BMI) 28.0-28.9, adult: Secondary | ICD-10-CM | POA: Diagnosis not present

## 2018-05-28 DIAGNOSIS — I4891 Unspecified atrial fibrillation: Secondary | ICD-10-CM | POA: Diagnosis not present

## 2018-05-28 DIAGNOSIS — N179 Acute kidney failure, unspecified: Secondary | ICD-10-CM | POA: Diagnosis not present

## 2018-05-28 DIAGNOSIS — N289 Disorder of kidney and ureter, unspecified: Secondary | ICD-10-CM | POA: Diagnosis not present

## 2018-05-28 DIAGNOSIS — I129 Hypertensive chronic kidney disease with stage 1 through stage 4 chronic kidney disease, or unspecified chronic kidney disease: Secondary | ICD-10-CM | POA: Diagnosis not present

## 2018-05-28 DIAGNOSIS — R079 Chest pain, unspecified: Secondary | ICD-10-CM | POA: Diagnosis not present

## 2018-05-28 DIAGNOSIS — I21A1 Myocardial infarction type 2: Secondary | ICD-10-CM | POA: Diagnosis not present

## 2018-05-28 DIAGNOSIS — R0789 Other chest pain: Secondary | ICD-10-CM | POA: Diagnosis not present

## 2018-05-28 DIAGNOSIS — E785 Hyperlipidemia, unspecified: Secondary | ICD-10-CM | POA: Diagnosis not present

## 2018-05-28 DIAGNOSIS — I130 Hypertensive heart and chronic kidney disease with heart failure and stage 1 through stage 4 chronic kidney disease, or unspecified chronic kidney disease: Secondary | ICD-10-CM | POA: Diagnosis not present

## 2018-05-28 DIAGNOSIS — R072 Precordial pain: Secondary | ICD-10-CM | POA: Diagnosis not present

## 2018-05-28 DIAGNOSIS — I272 Pulmonary hypertension, unspecified: Secondary | ICD-10-CM | POA: Diagnosis not present

## 2018-05-28 DIAGNOSIS — R410 Disorientation, unspecified: Secondary | ICD-10-CM | POA: Diagnosis not present

## 2018-05-28 DIAGNOSIS — I2510 Atherosclerotic heart disease of native coronary artery without angina pectoris: Secondary | ICD-10-CM | POA: Diagnosis not present

## 2018-05-28 DIAGNOSIS — I350 Nonrheumatic aortic (valve) stenosis: Secondary | ICD-10-CM | POA: Diagnosis not present

## 2018-05-28 DIAGNOSIS — N183 Chronic kidney disease, stage 3 (moderate): Secondary | ICD-10-CM | POA: Diagnosis not present

## 2018-06-14 DIAGNOSIS — I129 Hypertensive chronic kidney disease with stage 1 through stage 4 chronic kidney disease, or unspecified chronic kidney disease: Secondary | ICD-10-CM | POA: Diagnosis not present

## 2018-06-14 DIAGNOSIS — N184 Chronic kidney disease, stage 4 (severe): Secondary | ICD-10-CM | POA: Diagnosis not present

## 2018-06-14 DIAGNOSIS — E7800 Pure hypercholesterolemia, unspecified: Secondary | ICD-10-CM | POA: Diagnosis not present

## 2018-06-14 DIAGNOSIS — E1122 Type 2 diabetes mellitus with diabetic chronic kidney disease: Secondary | ICD-10-CM | POA: Diagnosis not present

## 2018-06-14 DIAGNOSIS — I4891 Unspecified atrial fibrillation: Secondary | ICD-10-CM | POA: Diagnosis not present

## 2018-06-14 DIAGNOSIS — I350 Nonrheumatic aortic (valve) stenosis: Secondary | ICD-10-CM | POA: Diagnosis not present

## 2018-06-17 DIAGNOSIS — I480 Paroxysmal atrial fibrillation: Secondary | ICD-10-CM | POA: Diagnosis not present

## 2018-06-17 DIAGNOSIS — I25118 Atherosclerotic heart disease of native coronary artery with other forms of angina pectoris: Secondary | ICD-10-CM | POA: Diagnosis not present

## 2018-06-17 DIAGNOSIS — I350 Nonrheumatic aortic (valve) stenosis: Secondary | ICD-10-CM | POA: Diagnosis not present

## 2018-06-17 DIAGNOSIS — I10 Essential (primary) hypertension: Secondary | ICD-10-CM | POA: Diagnosis not present

## 2018-06-24 DIAGNOSIS — N184 Chronic kidney disease, stage 4 (severe): Secondary | ICD-10-CM | POA: Diagnosis not present

## 2018-06-28 DIAGNOSIS — I708 Atherosclerosis of other arteries: Secondary | ICD-10-CM | POA: Diagnosis not present

## 2018-06-28 DIAGNOSIS — I129 Hypertensive chronic kidney disease with stage 1 through stage 4 chronic kidney disease, or unspecified chronic kidney disease: Secondary | ICD-10-CM | POA: Diagnosis not present

## 2018-06-28 DIAGNOSIS — N183 Chronic kidney disease, stage 3 (moderate): Secondary | ICD-10-CM | POA: Diagnosis not present

## 2018-06-28 DIAGNOSIS — E785 Hyperlipidemia, unspecified: Secondary | ICD-10-CM | POA: Diagnosis not present

## 2018-06-28 DIAGNOSIS — I480 Paroxysmal atrial fibrillation: Secondary | ICD-10-CM | POA: Diagnosis not present

## 2018-06-28 DIAGNOSIS — I350 Nonrheumatic aortic (valve) stenosis: Secondary | ICD-10-CM | POA: Diagnosis not present

## 2018-06-28 DIAGNOSIS — Z79899 Other long term (current) drug therapy: Secondary | ICD-10-CM | POA: Diagnosis not present

## 2018-07-14 DIAGNOSIS — I350 Nonrheumatic aortic (valve) stenosis: Secondary | ICD-10-CM | POA: Diagnosis not present

## 2018-07-14 DIAGNOSIS — Z1159 Encounter for screening for other viral diseases: Secondary | ICD-10-CM | POA: Diagnosis not present

## 2018-07-14 DIAGNOSIS — Z01812 Encounter for preprocedural laboratory examination: Secondary | ICD-10-CM | POA: Diagnosis not present

## 2018-07-20 DIAGNOSIS — J9811 Atelectasis: Secondary | ICD-10-CM | POA: Diagnosis not present

## 2018-07-20 DIAGNOSIS — I4519 Other right bundle-branch block: Secondary | ICD-10-CM | POA: Diagnosis not present

## 2018-07-20 DIAGNOSIS — I350 Nonrheumatic aortic (valve) stenosis: Secondary | ICD-10-CM | POA: Diagnosis not present

## 2018-07-20 DIAGNOSIS — Z006 Encounter for examination for normal comparison and control in clinical research program: Secondary | ICD-10-CM | POA: Diagnosis not present

## 2018-07-20 DIAGNOSIS — Z952 Presence of prosthetic heart valve: Secondary | ICD-10-CM | POA: Diagnosis not present

## 2018-07-20 DIAGNOSIS — I480 Paroxysmal atrial fibrillation: Secondary | ICD-10-CM | POA: Diagnosis not present

## 2018-07-20 DIAGNOSIS — I340 Nonrheumatic mitral (valve) insufficiency: Secondary | ICD-10-CM | POA: Diagnosis not present

## 2018-07-20 DIAGNOSIS — Z7901 Long term (current) use of anticoagulants: Secondary | ICD-10-CM | POA: Diagnosis not present

## 2018-07-20 DIAGNOSIS — I97710 Intraoperative cardiac arrest during cardiac surgery: Secondary | ICD-10-CM | POA: Diagnosis not present

## 2018-07-20 DIAGNOSIS — J90 Pleural effusion, not elsewhere classified: Secondary | ICD-10-CM | POA: Diagnosis not present

## 2018-07-20 DIAGNOSIS — I442 Atrioventricular block, complete: Secondary | ICD-10-CM | POA: Diagnosis not present

## 2018-07-20 DIAGNOSIS — D696 Thrombocytopenia, unspecified: Secondary | ICD-10-CM | POA: Diagnosis not present

## 2018-07-20 DIAGNOSIS — I97190 Other postprocedural cardiac functional disturbances following cardiac surgery: Secondary | ICD-10-CM | POA: Diagnosis not present

## 2018-07-20 DIAGNOSIS — R079 Chest pain, unspecified: Secondary | ICD-10-CM | POA: Diagnosis not present

## 2018-07-20 DIAGNOSIS — I129 Hypertensive chronic kidney disease with stage 1 through stage 4 chronic kidney disease, or unspecified chronic kidney disease: Secondary | ICD-10-CM | POA: Diagnosis not present

## 2018-07-20 DIAGNOSIS — I4510 Unspecified right bundle-branch block: Secondary | ICD-10-CM | POA: Diagnosis not present

## 2018-07-20 DIAGNOSIS — E1122 Type 2 diabetes mellitus with diabetic chronic kidney disease: Secondary | ICD-10-CM | POA: Diagnosis not present

## 2018-07-20 DIAGNOSIS — I97120 Postprocedural cardiac arrest following cardiac surgery: Secondary | ICD-10-CM | POA: Diagnosis not present

## 2018-07-20 DIAGNOSIS — I469 Cardiac arrest, cause unspecified: Secondary | ICD-10-CM | POA: Diagnosis not present

## 2018-07-20 DIAGNOSIS — Z950 Presence of cardiac pacemaker: Secondary | ICD-10-CM | POA: Diagnosis not present

## 2018-07-20 DIAGNOSIS — N183 Chronic kidney disease, stage 3 (moderate): Secondary | ICD-10-CM | POA: Diagnosis not present

## 2018-07-20 DIAGNOSIS — R0602 Shortness of breath: Secondary | ICD-10-CM | POA: Diagnosis not present

## 2018-07-20 DIAGNOSIS — R002 Palpitations: Secondary | ICD-10-CM | POA: Diagnosis not present

## 2018-07-29 DIAGNOSIS — E1122 Type 2 diabetes mellitus with diabetic chronic kidney disease: Secondary | ICD-10-CM | POA: Diagnosis not present

## 2018-07-29 DIAGNOSIS — N183 Chronic kidney disease, stage 3 (moderate): Secondary | ICD-10-CM | POA: Diagnosis not present

## 2018-07-29 DIAGNOSIS — I081 Rheumatic disorders of both mitral and tricuspid valves: Secondary | ICD-10-CM | POA: Diagnosis not present

## 2018-07-29 DIAGNOSIS — I2510 Atherosclerotic heart disease of native coronary artery without angina pectoris: Secondary | ICD-10-CM | POA: Diagnosis not present

## 2018-07-29 DIAGNOSIS — I272 Pulmonary hypertension, unspecified: Secondary | ICD-10-CM | POA: Diagnosis not present

## 2018-07-29 DIAGNOSIS — I129 Hypertensive chronic kidney disease with stage 1 through stage 4 chronic kidney disease, or unspecified chronic kidney disease: Secondary | ICD-10-CM | POA: Diagnosis not present

## 2018-07-29 DIAGNOSIS — I480 Paroxysmal atrial fibrillation: Secondary | ICD-10-CM | POA: Diagnosis not present

## 2018-07-29 DIAGNOSIS — Z48812 Encounter for surgical aftercare following surgery on the circulatory system: Secondary | ICD-10-CM | POA: Diagnosis not present

## 2018-07-29 DIAGNOSIS — D631 Anemia in chronic kidney disease: Secondary | ICD-10-CM | POA: Diagnosis not present

## 2018-07-31 DIAGNOSIS — I129 Hypertensive chronic kidney disease with stage 1 through stage 4 chronic kidney disease, or unspecified chronic kidney disease: Secondary | ICD-10-CM | POA: Diagnosis not present

## 2018-07-31 DIAGNOSIS — I480 Paroxysmal atrial fibrillation: Secondary | ICD-10-CM | POA: Diagnosis not present

## 2018-07-31 DIAGNOSIS — I081 Rheumatic disorders of both mitral and tricuspid valves: Secondary | ICD-10-CM | POA: Diagnosis not present

## 2018-07-31 DIAGNOSIS — I2510 Atherosclerotic heart disease of native coronary artery without angina pectoris: Secondary | ICD-10-CM | POA: Diagnosis not present

## 2018-07-31 DIAGNOSIS — N183 Chronic kidney disease, stage 3 (moderate): Secondary | ICD-10-CM | POA: Diagnosis not present

## 2018-07-31 DIAGNOSIS — Z48812 Encounter for surgical aftercare following surgery on the circulatory system: Secondary | ICD-10-CM | POA: Diagnosis not present

## 2018-07-31 DIAGNOSIS — I272 Pulmonary hypertension, unspecified: Secondary | ICD-10-CM | POA: Diagnosis not present

## 2018-07-31 DIAGNOSIS — E1122 Type 2 diabetes mellitus with diabetic chronic kidney disease: Secondary | ICD-10-CM | POA: Diagnosis not present

## 2018-07-31 DIAGNOSIS — D631 Anemia in chronic kidney disease: Secondary | ICD-10-CM | POA: Diagnosis not present

## 2018-08-01 DIAGNOSIS — I129 Hypertensive chronic kidney disease with stage 1 through stage 4 chronic kidney disease, or unspecified chronic kidney disease: Secondary | ICD-10-CM | POA: Diagnosis not present

## 2018-08-01 DIAGNOSIS — Z952 Presence of prosthetic heart valve: Secondary | ICD-10-CM | POA: Diagnosis not present

## 2018-08-01 DIAGNOSIS — Z950 Presence of cardiac pacemaker: Secondary | ICD-10-CM | POA: Diagnosis not present

## 2018-08-01 DIAGNOSIS — N183 Chronic kidney disease, stage 3 (moderate): Secondary | ICD-10-CM | POA: Diagnosis not present

## 2018-08-01 DIAGNOSIS — I480 Paroxysmal atrial fibrillation: Secondary | ICD-10-CM | POA: Diagnosis not present

## 2018-08-01 DIAGNOSIS — I350 Nonrheumatic aortic (valve) stenosis: Secondary | ICD-10-CM | POA: Diagnosis not present

## 2018-08-01 DIAGNOSIS — I25118 Atherosclerotic heart disease of native coronary artery with other forms of angina pectoris: Secondary | ICD-10-CM | POA: Diagnosis not present

## 2018-08-02 DIAGNOSIS — D631 Anemia in chronic kidney disease: Secondary | ICD-10-CM | POA: Diagnosis not present

## 2018-08-02 DIAGNOSIS — I480 Paroxysmal atrial fibrillation: Secondary | ICD-10-CM | POA: Diagnosis not present

## 2018-08-02 DIAGNOSIS — Z48812 Encounter for surgical aftercare following surgery on the circulatory system: Secondary | ICD-10-CM | POA: Diagnosis not present

## 2018-08-02 DIAGNOSIS — I2510 Atherosclerotic heart disease of native coronary artery without angina pectoris: Secondary | ICD-10-CM | POA: Diagnosis not present

## 2018-08-02 DIAGNOSIS — N183 Chronic kidney disease, stage 3 (moderate): Secondary | ICD-10-CM | POA: Diagnosis not present

## 2018-08-02 DIAGNOSIS — I081 Rheumatic disorders of both mitral and tricuspid valves: Secondary | ICD-10-CM | POA: Diagnosis not present

## 2018-08-02 DIAGNOSIS — I129 Hypertensive chronic kidney disease with stage 1 through stage 4 chronic kidney disease, or unspecified chronic kidney disease: Secondary | ICD-10-CM | POA: Diagnosis not present

## 2018-08-02 DIAGNOSIS — I272 Pulmonary hypertension, unspecified: Secondary | ICD-10-CM | POA: Diagnosis not present

## 2018-08-02 DIAGNOSIS — E1122 Type 2 diabetes mellitus with diabetic chronic kidney disease: Secondary | ICD-10-CM | POA: Diagnosis not present

## 2018-08-03 DIAGNOSIS — I10 Essential (primary) hypertension: Secondary | ICD-10-CM | POA: Diagnosis not present

## 2018-08-03 DIAGNOSIS — I2510 Atherosclerotic heart disease of native coronary artery without angina pectoris: Secondary | ICD-10-CM | POA: Diagnosis not present

## 2018-08-03 DIAGNOSIS — I469 Cardiac arrest, cause unspecified: Secondary | ICD-10-CM | POA: Diagnosis not present

## 2018-08-03 DIAGNOSIS — I480 Paroxysmal atrial fibrillation: Secondary | ICD-10-CM | POA: Diagnosis not present

## 2018-08-03 DIAGNOSIS — Z952 Presence of prosthetic heart valve: Secondary | ICD-10-CM | POA: Diagnosis not present

## 2018-08-04 DIAGNOSIS — I4891 Unspecified atrial fibrillation: Secondary | ICD-10-CM | POA: Diagnosis not present

## 2018-08-04 DIAGNOSIS — Z950 Presence of cardiac pacemaker: Secondary | ICD-10-CM | POA: Diagnosis not present

## 2018-08-04 DIAGNOSIS — Z4509 Encounter for adjustment and management of other cardiac device: Secondary | ICD-10-CM | POA: Diagnosis not present

## 2018-08-05 DIAGNOSIS — I480 Paroxysmal atrial fibrillation: Secondary | ICD-10-CM | POA: Diagnosis not present

## 2018-08-05 DIAGNOSIS — I2510 Atherosclerotic heart disease of native coronary artery without angina pectoris: Secondary | ICD-10-CM | POA: Diagnosis not present

## 2018-08-05 DIAGNOSIS — I272 Pulmonary hypertension, unspecified: Secondary | ICD-10-CM | POA: Diagnosis not present

## 2018-08-05 DIAGNOSIS — D631 Anemia in chronic kidney disease: Secondary | ICD-10-CM | POA: Diagnosis not present

## 2018-08-05 DIAGNOSIS — N183 Chronic kidney disease, stage 3 (moderate): Secondary | ICD-10-CM | POA: Diagnosis not present

## 2018-08-05 DIAGNOSIS — Z48812 Encounter for surgical aftercare following surgery on the circulatory system: Secondary | ICD-10-CM | POA: Diagnosis not present

## 2018-08-05 DIAGNOSIS — I081 Rheumatic disorders of both mitral and tricuspid valves: Secondary | ICD-10-CM | POA: Diagnosis not present

## 2018-08-05 DIAGNOSIS — I129 Hypertensive chronic kidney disease with stage 1 through stage 4 chronic kidney disease, or unspecified chronic kidney disease: Secondary | ICD-10-CM | POA: Diagnosis not present

## 2018-08-05 DIAGNOSIS — E1122 Type 2 diabetes mellitus with diabetic chronic kidney disease: Secondary | ICD-10-CM | POA: Diagnosis not present

## 2018-08-06 DIAGNOSIS — N183 Chronic kidney disease, stage 3 (moderate): Secondary | ICD-10-CM | POA: Diagnosis not present

## 2018-08-06 DIAGNOSIS — I272 Pulmonary hypertension, unspecified: Secondary | ICD-10-CM | POA: Diagnosis not present

## 2018-08-06 DIAGNOSIS — E1122 Type 2 diabetes mellitus with diabetic chronic kidney disease: Secondary | ICD-10-CM | POA: Diagnosis not present

## 2018-08-06 DIAGNOSIS — D631 Anemia in chronic kidney disease: Secondary | ICD-10-CM | POA: Diagnosis not present

## 2018-08-06 DIAGNOSIS — I2510 Atherosclerotic heart disease of native coronary artery without angina pectoris: Secondary | ICD-10-CM | POA: Diagnosis not present

## 2018-08-06 DIAGNOSIS — I480 Paroxysmal atrial fibrillation: Secondary | ICD-10-CM | POA: Diagnosis not present

## 2018-08-06 DIAGNOSIS — Z48812 Encounter for surgical aftercare following surgery on the circulatory system: Secondary | ICD-10-CM | POA: Diagnosis not present

## 2018-08-06 DIAGNOSIS — I129 Hypertensive chronic kidney disease with stage 1 through stage 4 chronic kidney disease, or unspecified chronic kidney disease: Secondary | ICD-10-CM | POA: Diagnosis not present

## 2018-08-06 DIAGNOSIS — I081 Rheumatic disorders of both mitral and tricuspid valves: Secondary | ICD-10-CM | POA: Diagnosis not present

## 2018-08-09 DIAGNOSIS — I081 Rheumatic disorders of both mitral and tricuspid valves: Secondary | ICD-10-CM | POA: Diagnosis not present

## 2018-08-09 DIAGNOSIS — I2510 Atherosclerotic heart disease of native coronary artery without angina pectoris: Secondary | ICD-10-CM | POA: Diagnosis not present

## 2018-08-09 DIAGNOSIS — E1122 Type 2 diabetes mellitus with diabetic chronic kidney disease: Secondary | ICD-10-CM | POA: Diagnosis not present

## 2018-08-09 DIAGNOSIS — N183 Chronic kidney disease, stage 3 (moderate): Secondary | ICD-10-CM | POA: Diagnosis not present

## 2018-08-09 DIAGNOSIS — I480 Paroxysmal atrial fibrillation: Secondary | ICD-10-CM | POA: Diagnosis not present

## 2018-08-09 DIAGNOSIS — I272 Pulmonary hypertension, unspecified: Secondary | ICD-10-CM | POA: Diagnosis not present

## 2018-08-09 DIAGNOSIS — D631 Anemia in chronic kidney disease: Secondary | ICD-10-CM | POA: Diagnosis not present

## 2018-08-09 DIAGNOSIS — Z48812 Encounter for surgical aftercare following surgery on the circulatory system: Secondary | ICD-10-CM | POA: Diagnosis not present

## 2018-08-09 DIAGNOSIS — I129 Hypertensive chronic kidney disease with stage 1 through stage 4 chronic kidney disease, or unspecified chronic kidney disease: Secondary | ICD-10-CM | POA: Diagnosis not present

## 2018-08-10 ENCOUNTER — Telehealth (HOSPITAL_COMMUNITY): Payer: Self-pay | Admitting: *Deleted

## 2018-08-10 DIAGNOSIS — Z48812 Encounter for surgical aftercare following surgery on the circulatory system: Secondary | ICD-10-CM | POA: Diagnosis not present

## 2018-08-10 DIAGNOSIS — I129 Hypertensive chronic kidney disease with stage 1 through stage 4 chronic kidney disease, or unspecified chronic kidney disease: Secondary | ICD-10-CM | POA: Diagnosis not present

## 2018-08-10 DIAGNOSIS — E1122 Type 2 diabetes mellitus with diabetic chronic kidney disease: Secondary | ICD-10-CM | POA: Diagnosis not present

## 2018-08-10 DIAGNOSIS — D631 Anemia in chronic kidney disease: Secondary | ICD-10-CM | POA: Diagnosis not present

## 2018-08-10 DIAGNOSIS — I2510 Atherosclerotic heart disease of native coronary artery without angina pectoris: Secondary | ICD-10-CM | POA: Diagnosis not present

## 2018-08-10 DIAGNOSIS — I480 Paroxysmal atrial fibrillation: Secondary | ICD-10-CM | POA: Diagnosis not present

## 2018-08-10 DIAGNOSIS — N183 Chronic kidney disease, stage 3 (moderate): Secondary | ICD-10-CM | POA: Diagnosis not present

## 2018-08-10 DIAGNOSIS — I081 Rheumatic disorders of both mitral and tricuspid valves: Secondary | ICD-10-CM | POA: Diagnosis not present

## 2018-08-10 DIAGNOSIS — I272 Pulmonary hypertension, unspecified: Secondary | ICD-10-CM | POA: Diagnosis not present

## 2018-08-10 NOTE — Telephone Encounter (Signed)
Received referral for cardiac rehab for this pt to participate in cardiac rehab from Dr. Synetta Shadow at [facility] had TAVR on 7/9 and PPM on 7/14 at [facility].  Reviewed medical records in [facility].  Pt is appropriate to participate in Cardiac Rehab after clearance from cardiologist after PPM placement. Contacted pt and pt is interested but would like to delay exercise until late August.  Will send MD order form and request 12 lead ekg.  Will forward this to support staff for insurance benefits and eligibility. Cherre Huger, BSN Cardiac and Training and development officer

## 2018-08-11 DIAGNOSIS — N183 Chronic kidney disease, stage 3 (moderate): Secondary | ICD-10-CM | POA: Diagnosis not present

## 2018-08-11 DIAGNOSIS — Z48812 Encounter for surgical aftercare following surgery on the circulatory system: Secondary | ICD-10-CM | POA: Diagnosis not present

## 2018-08-11 DIAGNOSIS — I129 Hypertensive chronic kidney disease with stage 1 through stage 4 chronic kidney disease, or unspecified chronic kidney disease: Secondary | ICD-10-CM | POA: Diagnosis not present

## 2018-08-11 DIAGNOSIS — I2510 Atherosclerotic heart disease of native coronary artery without angina pectoris: Secondary | ICD-10-CM | POA: Diagnosis not present

## 2018-08-11 DIAGNOSIS — E1122 Type 2 diabetes mellitus with diabetic chronic kidney disease: Secondary | ICD-10-CM | POA: Diagnosis not present

## 2018-08-11 DIAGNOSIS — D631 Anemia in chronic kidney disease: Secondary | ICD-10-CM | POA: Diagnosis not present

## 2018-08-11 DIAGNOSIS — I480 Paroxysmal atrial fibrillation: Secondary | ICD-10-CM | POA: Diagnosis not present

## 2018-08-11 DIAGNOSIS — I272 Pulmonary hypertension, unspecified: Secondary | ICD-10-CM | POA: Diagnosis not present

## 2018-08-11 DIAGNOSIS — I081 Rheumatic disorders of both mitral and tricuspid valves: Secondary | ICD-10-CM | POA: Diagnosis not present

## 2018-08-12 DIAGNOSIS — I129 Hypertensive chronic kidney disease with stage 1 through stage 4 chronic kidney disease, or unspecified chronic kidney disease: Secondary | ICD-10-CM | POA: Diagnosis not present

## 2018-08-12 DIAGNOSIS — Z48812 Encounter for surgical aftercare following surgery on the circulatory system: Secondary | ICD-10-CM | POA: Diagnosis not present

## 2018-08-12 DIAGNOSIS — I081 Rheumatic disorders of both mitral and tricuspid valves: Secondary | ICD-10-CM | POA: Diagnosis not present

## 2018-08-12 DIAGNOSIS — D631 Anemia in chronic kidney disease: Secondary | ICD-10-CM | POA: Diagnosis not present

## 2018-08-12 DIAGNOSIS — N183 Chronic kidney disease, stage 3 (moderate): Secondary | ICD-10-CM | POA: Diagnosis not present

## 2018-08-12 DIAGNOSIS — I2510 Atherosclerotic heart disease of native coronary artery without angina pectoris: Secondary | ICD-10-CM | POA: Diagnosis not present

## 2018-08-12 DIAGNOSIS — I480 Paroxysmal atrial fibrillation: Secondary | ICD-10-CM | POA: Diagnosis not present

## 2018-08-12 DIAGNOSIS — E1122 Type 2 diabetes mellitus with diabetic chronic kidney disease: Secondary | ICD-10-CM | POA: Diagnosis not present

## 2018-08-12 DIAGNOSIS — I272 Pulmonary hypertension, unspecified: Secondary | ICD-10-CM | POA: Diagnosis not present

## 2018-08-15 DIAGNOSIS — R350 Frequency of micturition: Secondary | ICD-10-CM | POA: Diagnosis not present

## 2018-08-16 DIAGNOSIS — I2510 Atherosclerotic heart disease of native coronary artery without angina pectoris: Secondary | ICD-10-CM | POA: Diagnosis not present

## 2018-08-16 DIAGNOSIS — E1122 Type 2 diabetes mellitus with diabetic chronic kidney disease: Secondary | ICD-10-CM | POA: Diagnosis not present

## 2018-08-16 DIAGNOSIS — D631 Anemia in chronic kidney disease: Secondary | ICD-10-CM | POA: Diagnosis not present

## 2018-08-16 DIAGNOSIS — I480 Paroxysmal atrial fibrillation: Secondary | ICD-10-CM | POA: Diagnosis not present

## 2018-08-16 DIAGNOSIS — I081 Rheumatic disorders of both mitral and tricuspid valves: Secondary | ICD-10-CM | POA: Diagnosis not present

## 2018-08-16 DIAGNOSIS — I272 Pulmonary hypertension, unspecified: Secondary | ICD-10-CM | POA: Diagnosis not present

## 2018-08-16 DIAGNOSIS — I129 Hypertensive chronic kidney disease with stage 1 through stage 4 chronic kidney disease, or unspecified chronic kidney disease: Secondary | ICD-10-CM | POA: Diagnosis not present

## 2018-08-16 DIAGNOSIS — Z48812 Encounter for surgical aftercare following surgery on the circulatory system: Secondary | ICD-10-CM | POA: Diagnosis not present

## 2018-08-16 DIAGNOSIS — N183 Chronic kidney disease, stage 3 (moderate): Secondary | ICD-10-CM | POA: Diagnosis not present

## 2018-08-17 DIAGNOSIS — I129 Hypertensive chronic kidney disease with stage 1 through stage 4 chronic kidney disease, or unspecified chronic kidney disease: Secondary | ICD-10-CM | POA: Diagnosis not present

## 2018-08-17 DIAGNOSIS — N183 Chronic kidney disease, stage 3 (moderate): Secondary | ICD-10-CM | POA: Diagnosis not present

## 2018-08-17 DIAGNOSIS — D509 Iron deficiency anemia, unspecified: Secondary | ICD-10-CM | POA: Diagnosis not present

## 2018-08-17 DIAGNOSIS — I480 Paroxysmal atrial fibrillation: Secondary | ICD-10-CM | POA: Diagnosis not present

## 2018-08-17 DIAGNOSIS — M25511 Pain in right shoulder: Secondary | ICD-10-CM | POA: Diagnosis not present

## 2018-08-17 DIAGNOSIS — Z48812 Encounter for surgical aftercare following surgery on the circulatory system: Secondary | ICD-10-CM | POA: Diagnosis not present

## 2018-08-17 DIAGNOSIS — D631 Anemia in chronic kidney disease: Secondary | ICD-10-CM | POA: Diagnosis not present

## 2018-08-17 DIAGNOSIS — E7800 Pure hypercholesterolemia, unspecified: Secondary | ICD-10-CM | POA: Diagnosis not present

## 2018-08-17 DIAGNOSIS — E039 Hypothyroidism, unspecified: Secondary | ICD-10-CM | POA: Diagnosis not present

## 2018-08-17 DIAGNOSIS — I2510 Atherosclerotic heart disease of native coronary artery without angina pectoris: Secondary | ICD-10-CM | POA: Diagnosis not present

## 2018-08-17 DIAGNOSIS — D6869 Other thrombophilia: Secondary | ICD-10-CM | POA: Diagnosis not present

## 2018-08-17 DIAGNOSIS — I272 Pulmonary hypertension, unspecified: Secondary | ICD-10-CM | POA: Diagnosis not present

## 2018-08-17 DIAGNOSIS — I081 Rheumatic disorders of both mitral and tricuspid valves: Secondary | ICD-10-CM | POA: Diagnosis not present

## 2018-08-17 DIAGNOSIS — E1122 Type 2 diabetes mellitus with diabetic chronic kidney disease: Secondary | ICD-10-CM | POA: Diagnosis not present

## 2018-08-22 DIAGNOSIS — Z48812 Encounter for surgical aftercare following surgery on the circulatory system: Secondary | ICD-10-CM | POA: Diagnosis not present

## 2018-08-22 DIAGNOSIS — N183 Chronic kidney disease, stage 3 (moderate): Secondary | ICD-10-CM | POA: Diagnosis not present

## 2018-08-22 DIAGNOSIS — E1122 Type 2 diabetes mellitus with diabetic chronic kidney disease: Secondary | ICD-10-CM | POA: Diagnosis not present

## 2018-08-22 DIAGNOSIS — I480 Paroxysmal atrial fibrillation: Secondary | ICD-10-CM | POA: Diagnosis not present

## 2018-08-22 DIAGNOSIS — I081 Rheumatic disorders of both mitral and tricuspid valves: Secondary | ICD-10-CM | POA: Diagnosis not present

## 2018-08-22 DIAGNOSIS — I2510 Atherosclerotic heart disease of native coronary artery without angina pectoris: Secondary | ICD-10-CM | POA: Diagnosis not present

## 2018-08-22 DIAGNOSIS — D631 Anemia in chronic kidney disease: Secondary | ICD-10-CM | POA: Diagnosis not present

## 2018-08-22 DIAGNOSIS — I129 Hypertensive chronic kidney disease with stage 1 through stage 4 chronic kidney disease, or unspecified chronic kidney disease: Secondary | ICD-10-CM | POA: Diagnosis not present

## 2018-08-22 DIAGNOSIS — I272 Pulmonary hypertension, unspecified: Secondary | ICD-10-CM | POA: Diagnosis not present

## 2018-08-24 DIAGNOSIS — Z48812 Encounter for surgical aftercare following surgery on the circulatory system: Secondary | ICD-10-CM | POA: Diagnosis not present

## 2018-08-24 DIAGNOSIS — E1122 Type 2 diabetes mellitus with diabetic chronic kidney disease: Secondary | ICD-10-CM | POA: Diagnosis not present

## 2018-08-24 DIAGNOSIS — N183 Chronic kidney disease, stage 3 (moderate): Secondary | ICD-10-CM | POA: Diagnosis not present

## 2018-08-24 DIAGNOSIS — D631 Anemia in chronic kidney disease: Secondary | ICD-10-CM | POA: Diagnosis not present

## 2018-08-24 DIAGNOSIS — I129 Hypertensive chronic kidney disease with stage 1 through stage 4 chronic kidney disease, or unspecified chronic kidney disease: Secondary | ICD-10-CM | POA: Diagnosis not present

## 2018-08-24 DIAGNOSIS — I081 Rheumatic disorders of both mitral and tricuspid valves: Secondary | ICD-10-CM | POA: Diagnosis not present

## 2018-08-24 DIAGNOSIS — I2510 Atherosclerotic heart disease of native coronary artery without angina pectoris: Secondary | ICD-10-CM | POA: Diagnosis not present

## 2018-08-24 DIAGNOSIS — I272 Pulmonary hypertension, unspecified: Secondary | ICD-10-CM | POA: Diagnosis not present

## 2018-08-24 DIAGNOSIS — I480 Paroxysmal atrial fibrillation: Secondary | ICD-10-CM | POA: Diagnosis not present

## 2018-08-30 DIAGNOSIS — I2510 Atherosclerotic heart disease of native coronary artery without angina pectoris: Secondary | ICD-10-CM | POA: Diagnosis not present

## 2018-08-30 DIAGNOSIS — I480 Paroxysmal atrial fibrillation: Secondary | ICD-10-CM | POA: Diagnosis not present

## 2018-08-30 DIAGNOSIS — E1122 Type 2 diabetes mellitus with diabetic chronic kidney disease: Secondary | ICD-10-CM | POA: Diagnosis not present

## 2018-08-30 DIAGNOSIS — I272 Pulmonary hypertension, unspecified: Secondary | ICD-10-CM | POA: Diagnosis not present

## 2018-08-30 DIAGNOSIS — I081 Rheumatic disorders of both mitral and tricuspid valves: Secondary | ICD-10-CM | POA: Diagnosis not present

## 2018-08-30 DIAGNOSIS — N183 Chronic kidney disease, stage 3 (moderate): Secondary | ICD-10-CM | POA: Diagnosis not present

## 2018-08-30 DIAGNOSIS — Z48812 Encounter for surgical aftercare following surgery on the circulatory system: Secondary | ICD-10-CM | POA: Diagnosis not present

## 2018-08-30 DIAGNOSIS — I129 Hypertensive chronic kidney disease with stage 1 through stage 4 chronic kidney disease, or unspecified chronic kidney disease: Secondary | ICD-10-CM | POA: Diagnosis not present

## 2018-08-30 DIAGNOSIS — D631 Anemia in chronic kidney disease: Secondary | ICD-10-CM | POA: Diagnosis not present

## 2018-08-31 DIAGNOSIS — I517 Cardiomegaly: Secondary | ICD-10-CM | POA: Diagnosis not present

## 2018-08-31 DIAGNOSIS — I351 Nonrheumatic aortic (valve) insufficiency: Secondary | ICD-10-CM | POA: Diagnosis not present

## 2018-08-31 DIAGNOSIS — I080 Rheumatic disorders of both mitral and aortic valves: Secondary | ICD-10-CM | POA: Diagnosis not present

## 2018-08-31 DIAGNOSIS — Z952 Presence of prosthetic heart valve: Secondary | ICD-10-CM | POA: Diagnosis not present

## 2018-08-31 DIAGNOSIS — I342 Nonrheumatic mitral (valve) stenosis: Secondary | ICD-10-CM | POA: Diagnosis not present

## 2018-09-01 DIAGNOSIS — E1122 Type 2 diabetes mellitus with diabetic chronic kidney disease: Secondary | ICD-10-CM | POA: Diagnosis not present

## 2018-09-01 DIAGNOSIS — I129 Hypertensive chronic kidney disease with stage 1 through stage 4 chronic kidney disease, or unspecified chronic kidney disease: Secondary | ICD-10-CM | POA: Diagnosis not present

## 2018-09-02 DIAGNOSIS — D631 Anemia in chronic kidney disease: Secondary | ICD-10-CM | POA: Diagnosis not present

## 2018-09-02 DIAGNOSIS — I2510 Atherosclerotic heart disease of native coronary artery without angina pectoris: Secondary | ICD-10-CM | POA: Diagnosis not present

## 2018-09-02 DIAGNOSIS — N183 Chronic kidney disease, stage 3 (moderate): Secondary | ICD-10-CM | POA: Diagnosis not present

## 2018-09-02 DIAGNOSIS — I129 Hypertensive chronic kidney disease with stage 1 through stage 4 chronic kidney disease, or unspecified chronic kidney disease: Secondary | ICD-10-CM | POA: Diagnosis not present

## 2018-09-02 DIAGNOSIS — I272 Pulmonary hypertension, unspecified: Secondary | ICD-10-CM | POA: Diagnosis not present

## 2018-09-02 DIAGNOSIS — I081 Rheumatic disorders of both mitral and tricuspid valves: Secondary | ICD-10-CM | POA: Diagnosis not present

## 2018-09-02 DIAGNOSIS — E1122 Type 2 diabetes mellitus with diabetic chronic kidney disease: Secondary | ICD-10-CM | POA: Diagnosis not present

## 2018-09-02 DIAGNOSIS — I480 Paroxysmal atrial fibrillation: Secondary | ICD-10-CM | POA: Diagnosis not present

## 2018-09-02 DIAGNOSIS — Z48812 Encounter for surgical aftercare following surgery on the circulatory system: Secondary | ICD-10-CM | POA: Diagnosis not present

## 2018-09-05 DIAGNOSIS — I25118 Atherosclerotic heart disease of native coronary artery with other forms of angina pectoris: Secondary | ICD-10-CM | POA: Diagnosis not present

## 2018-09-05 DIAGNOSIS — I10 Essential (primary) hypertension: Secondary | ICD-10-CM | POA: Diagnosis not present

## 2018-09-05 DIAGNOSIS — I252 Old myocardial infarction: Secondary | ICD-10-CM | POA: Diagnosis not present

## 2018-09-05 DIAGNOSIS — E785 Hyperlipidemia, unspecified: Secondary | ICD-10-CM | POA: Diagnosis not present

## 2018-09-05 DIAGNOSIS — N184 Chronic kidney disease, stage 4 (severe): Secondary | ICD-10-CM | POA: Diagnosis not present

## 2018-09-05 DIAGNOSIS — I469 Cardiac arrest, cause unspecified: Secondary | ICD-10-CM | POA: Diagnosis not present

## 2018-09-05 DIAGNOSIS — I350 Nonrheumatic aortic (valve) stenosis: Secondary | ICD-10-CM | POA: Diagnosis not present

## 2018-09-05 DIAGNOSIS — I480 Paroxysmal atrial fibrillation: Secondary | ICD-10-CM | POA: Diagnosis not present

## 2018-09-05 DIAGNOSIS — I129 Hypertensive chronic kidney disease with stage 1 through stage 4 chronic kidney disease, or unspecified chronic kidney disease: Secondary | ICD-10-CM | POA: Diagnosis not present

## 2018-09-05 DIAGNOSIS — Z950 Presence of cardiac pacemaker: Secondary | ICD-10-CM | POA: Diagnosis not present

## 2018-09-05 DIAGNOSIS — Z952 Presence of prosthetic heart valve: Secondary | ICD-10-CM | POA: Diagnosis not present

## 2018-09-06 DIAGNOSIS — E1122 Type 2 diabetes mellitus with diabetic chronic kidney disease: Secondary | ICD-10-CM | POA: Diagnosis not present

## 2018-09-06 DIAGNOSIS — I480 Paroxysmal atrial fibrillation: Secondary | ICD-10-CM | POA: Diagnosis not present

## 2018-09-06 DIAGNOSIS — N183 Chronic kidney disease, stage 3 (moderate): Secondary | ICD-10-CM | POA: Diagnosis not present

## 2018-09-06 DIAGNOSIS — Z48812 Encounter for surgical aftercare following surgery on the circulatory system: Secondary | ICD-10-CM | POA: Diagnosis not present

## 2018-09-06 DIAGNOSIS — I272 Pulmonary hypertension, unspecified: Secondary | ICD-10-CM | POA: Diagnosis not present

## 2018-09-06 DIAGNOSIS — I129 Hypertensive chronic kidney disease with stage 1 through stage 4 chronic kidney disease, or unspecified chronic kidney disease: Secondary | ICD-10-CM | POA: Diagnosis not present

## 2018-09-06 DIAGNOSIS — I2510 Atherosclerotic heart disease of native coronary artery without angina pectoris: Secondary | ICD-10-CM | POA: Diagnosis not present

## 2018-09-06 DIAGNOSIS — D631 Anemia in chronic kidney disease: Secondary | ICD-10-CM | POA: Diagnosis not present

## 2018-09-06 DIAGNOSIS — I081 Rheumatic disorders of both mitral and tricuspid valves: Secondary | ICD-10-CM | POA: Diagnosis not present

## 2018-09-12 DIAGNOSIS — E1122 Type 2 diabetes mellitus with diabetic chronic kidney disease: Secondary | ICD-10-CM | POA: Diagnosis not present

## 2018-09-12 DIAGNOSIS — I480 Paroxysmal atrial fibrillation: Secondary | ICD-10-CM | POA: Diagnosis not present

## 2018-09-12 DIAGNOSIS — I129 Hypertensive chronic kidney disease with stage 1 through stage 4 chronic kidney disease, or unspecified chronic kidney disease: Secondary | ICD-10-CM | POA: Diagnosis not present

## 2018-09-12 DIAGNOSIS — N183 Chronic kidney disease, stage 3 (moderate): Secondary | ICD-10-CM | POA: Diagnosis not present

## 2018-09-12 DIAGNOSIS — D631 Anemia in chronic kidney disease: Secondary | ICD-10-CM | POA: Diagnosis not present

## 2018-09-12 DIAGNOSIS — I272 Pulmonary hypertension, unspecified: Secondary | ICD-10-CM | POA: Diagnosis not present

## 2018-09-12 DIAGNOSIS — I081 Rheumatic disorders of both mitral and tricuspid valves: Secondary | ICD-10-CM | POA: Diagnosis not present

## 2018-09-12 DIAGNOSIS — I2510 Atherosclerotic heart disease of native coronary artery without angina pectoris: Secondary | ICD-10-CM | POA: Diagnosis not present

## 2018-09-12 DIAGNOSIS — Z48812 Encounter for surgical aftercare following surgery on the circulatory system: Secondary | ICD-10-CM | POA: Diagnosis not present

## 2018-09-16 DIAGNOSIS — I480 Paroxysmal atrial fibrillation: Secondary | ICD-10-CM | POA: Diagnosis not present

## 2018-09-16 DIAGNOSIS — Z48812 Encounter for surgical aftercare following surgery on the circulatory system: Secondary | ICD-10-CM | POA: Diagnosis not present

## 2018-09-16 DIAGNOSIS — I081 Rheumatic disorders of both mitral and tricuspid valves: Secondary | ICD-10-CM | POA: Diagnosis not present

## 2018-09-16 DIAGNOSIS — D631 Anemia in chronic kidney disease: Secondary | ICD-10-CM | POA: Diagnosis not present

## 2018-09-16 DIAGNOSIS — I129 Hypertensive chronic kidney disease with stage 1 through stage 4 chronic kidney disease, or unspecified chronic kidney disease: Secondary | ICD-10-CM | POA: Diagnosis not present

## 2018-09-16 DIAGNOSIS — I2510 Atherosclerotic heart disease of native coronary artery without angina pectoris: Secondary | ICD-10-CM | POA: Diagnosis not present

## 2018-09-16 DIAGNOSIS — I272 Pulmonary hypertension, unspecified: Secondary | ICD-10-CM | POA: Diagnosis not present

## 2018-09-16 DIAGNOSIS — E1122 Type 2 diabetes mellitus with diabetic chronic kidney disease: Secondary | ICD-10-CM | POA: Diagnosis not present

## 2018-09-16 DIAGNOSIS — N183 Chronic kidney disease, stage 3 (moderate): Secondary | ICD-10-CM | POA: Diagnosis not present

## 2018-09-22 DIAGNOSIS — I272 Pulmonary hypertension, unspecified: Secondary | ICD-10-CM | POA: Diagnosis not present

## 2018-09-22 DIAGNOSIS — I129 Hypertensive chronic kidney disease with stage 1 through stage 4 chronic kidney disease, or unspecified chronic kidney disease: Secondary | ICD-10-CM | POA: Diagnosis not present

## 2018-09-22 DIAGNOSIS — I2510 Atherosclerotic heart disease of native coronary artery without angina pectoris: Secondary | ICD-10-CM | POA: Diagnosis not present

## 2018-09-22 DIAGNOSIS — N183 Chronic kidney disease, stage 3 (moderate): Secondary | ICD-10-CM | POA: Diagnosis not present

## 2018-09-22 DIAGNOSIS — I480 Paroxysmal atrial fibrillation: Secondary | ICD-10-CM | POA: Diagnosis not present

## 2018-09-22 DIAGNOSIS — I081 Rheumatic disorders of both mitral and tricuspid valves: Secondary | ICD-10-CM | POA: Diagnosis not present

## 2018-09-22 DIAGNOSIS — D631 Anemia in chronic kidney disease: Secondary | ICD-10-CM | POA: Diagnosis not present

## 2018-09-22 DIAGNOSIS — Z48812 Encounter for surgical aftercare following surgery on the circulatory system: Secondary | ICD-10-CM | POA: Diagnosis not present

## 2018-09-22 DIAGNOSIS — E1122 Type 2 diabetes mellitus with diabetic chronic kidney disease: Secondary | ICD-10-CM | POA: Diagnosis not present

## 2018-10-11 DIAGNOSIS — H401131 Primary open-angle glaucoma, bilateral, mild stage: Secondary | ICD-10-CM | POA: Diagnosis not present

## 2018-10-11 DIAGNOSIS — E119 Type 2 diabetes mellitus without complications: Secondary | ICD-10-CM | POA: Diagnosis not present

## 2018-10-11 DIAGNOSIS — H04123 Dry eye syndrome of bilateral lacrimal glands: Secondary | ICD-10-CM | POA: Diagnosis not present

## 2018-10-11 DIAGNOSIS — H35373 Puckering of macula, bilateral: Secondary | ICD-10-CM | POA: Diagnosis not present

## 2018-10-11 DIAGNOSIS — H524 Presbyopia: Secondary | ICD-10-CM | POA: Diagnosis not present

## 2018-10-11 DIAGNOSIS — Z961 Presence of intraocular lens: Secondary | ICD-10-CM | POA: Diagnosis not present

## 2018-10-17 DIAGNOSIS — M19171 Post-traumatic osteoarthritis, right ankle and foot: Secondary | ICD-10-CM | POA: Diagnosis not present

## 2018-10-18 DIAGNOSIS — D509 Iron deficiency anemia, unspecified: Secondary | ICD-10-CM | POA: Diagnosis not present

## 2018-11-01 DIAGNOSIS — H04123 Dry eye syndrome of bilateral lacrimal glands: Secondary | ICD-10-CM | POA: Diagnosis not present

## 2018-11-01 DIAGNOSIS — E119 Type 2 diabetes mellitus without complications: Secondary | ICD-10-CM | POA: Diagnosis not present

## 2018-11-01 DIAGNOSIS — H401131 Primary open-angle glaucoma, bilateral, mild stage: Secondary | ICD-10-CM | POA: Diagnosis not present

## 2018-11-01 DIAGNOSIS — Z961 Presence of intraocular lens: Secondary | ICD-10-CM | POA: Diagnosis not present

## 2018-11-09 DIAGNOSIS — H04123 Dry eye syndrome of bilateral lacrimal glands: Secondary | ICD-10-CM | POA: Diagnosis not present

## 2018-11-09 DIAGNOSIS — E119 Type 2 diabetes mellitus without complications: Secondary | ICD-10-CM | POA: Diagnosis not present

## 2018-11-09 DIAGNOSIS — H401113 Primary open-angle glaucoma, right eye, severe stage: Secondary | ICD-10-CM | POA: Diagnosis not present

## 2018-11-09 DIAGNOSIS — H35373 Puckering of macula, bilateral: Secondary | ICD-10-CM | POA: Diagnosis not present

## 2018-11-09 DIAGNOSIS — H401133 Primary open-angle glaucoma, bilateral, severe stage: Secondary | ICD-10-CM | POA: Diagnosis not present

## 2018-11-09 DIAGNOSIS — H401123 Primary open-angle glaucoma, left eye, severe stage: Secondary | ICD-10-CM | POA: Diagnosis not present

## 2018-11-09 DIAGNOSIS — H43823 Vitreomacular adhesion, bilateral: Secondary | ICD-10-CM | POA: Diagnosis not present

## 2018-11-18 ENCOUNTER — Telehealth (HOSPITAL_COMMUNITY): Payer: Self-pay

## 2018-11-18 NOTE — Telephone Encounter (Signed)
Called and spoke with pt in regards to CR, pt stated she was not interested at this time. Will call when she is ready.  Closed referral

## 2018-12-01 DIAGNOSIS — Z45018 Encounter for adjustment and management of other part of cardiac pacemaker: Secondary | ICD-10-CM | POA: Diagnosis not present

## 2018-12-01 DIAGNOSIS — I495 Sick sinus syndrome: Secondary | ICD-10-CM | POA: Diagnosis not present

## 2018-12-13 DIAGNOSIS — H401113 Primary open-angle glaucoma, right eye, severe stage: Secondary | ICD-10-CM | POA: Diagnosis not present

## 2018-12-13 DIAGNOSIS — H401123 Primary open-angle glaucoma, left eye, severe stage: Secondary | ICD-10-CM | POA: Diagnosis not present

## 2018-12-13 DIAGNOSIS — E119 Type 2 diabetes mellitus without complications: Secondary | ICD-10-CM | POA: Diagnosis not present

## 2018-12-13 DIAGNOSIS — H04123 Dry eye syndrome of bilateral lacrimal glands: Secondary | ICD-10-CM | POA: Diagnosis not present

## 2018-12-13 DIAGNOSIS — H401133 Primary open-angle glaucoma, bilateral, severe stage: Secondary | ICD-10-CM | POA: Diagnosis not present

## 2018-12-13 DIAGNOSIS — H35373 Puckering of macula, bilateral: Secondary | ICD-10-CM | POA: Diagnosis not present

## 2018-12-13 DIAGNOSIS — H43823 Vitreomacular adhesion, bilateral: Secondary | ICD-10-CM | POA: Diagnosis not present

## 2018-12-23 DIAGNOSIS — M8580 Other specified disorders of bone density and structure, unspecified site: Secondary | ICD-10-CM | POA: Diagnosis not present

## 2018-12-23 DIAGNOSIS — E039 Hypothyroidism, unspecified: Secondary | ICD-10-CM | POA: Diagnosis not present

## 2018-12-23 DIAGNOSIS — I10 Essential (primary) hypertension: Secondary | ICD-10-CM | POA: Diagnosis not present

## 2018-12-23 DIAGNOSIS — N184 Chronic kidney disease, stage 4 (severe): Secondary | ICD-10-CM | POA: Diagnosis not present

## 2018-12-23 DIAGNOSIS — I129 Hypertensive chronic kidney disease with stage 1 through stage 4 chronic kidney disease, or unspecified chronic kidney disease: Secondary | ICD-10-CM | POA: Diagnosis not present

## 2018-12-23 DIAGNOSIS — I2510 Atherosclerotic heart disease of native coronary artery without angina pectoris: Secondary | ICD-10-CM | POA: Diagnosis not present

## 2018-12-23 DIAGNOSIS — I480 Paroxysmal atrial fibrillation: Secondary | ICD-10-CM | POA: Diagnosis not present

## 2018-12-23 DIAGNOSIS — D509 Iron deficiency anemia, unspecified: Secondary | ICD-10-CM | POA: Diagnosis not present

## 2018-12-23 DIAGNOSIS — E1122 Type 2 diabetes mellitus with diabetic chronic kidney disease: Secondary | ICD-10-CM | POA: Diagnosis not present

## 2019-01-15 DIAGNOSIS — Z950 Presence of cardiac pacemaker: Secondary | ICD-10-CM | POA: Diagnosis not present

## 2019-02-27 DIAGNOSIS — N183 Chronic kidney disease, stage 3 unspecified: Secondary | ICD-10-CM | POA: Diagnosis not present

## 2019-02-27 DIAGNOSIS — E039 Hypothyroidism, unspecified: Secondary | ICD-10-CM | POA: Diagnosis not present

## 2019-02-27 DIAGNOSIS — E7800 Pure hypercholesterolemia, unspecified: Secondary | ICD-10-CM | POA: Diagnosis not present

## 2019-02-27 DIAGNOSIS — E1122 Type 2 diabetes mellitus with diabetic chronic kidney disease: Secondary | ICD-10-CM | POA: Diagnosis not present

## 2019-02-27 DIAGNOSIS — D509 Iron deficiency anemia, unspecified: Secondary | ICD-10-CM | POA: Diagnosis not present

## 2019-02-27 DIAGNOSIS — I480 Paroxysmal atrial fibrillation: Secondary | ICD-10-CM | POA: Diagnosis not present

## 2019-02-27 DIAGNOSIS — M25511 Pain in right shoulder: Secondary | ICD-10-CM | POA: Diagnosis not present

## 2019-02-27 DIAGNOSIS — I129 Hypertensive chronic kidney disease with stage 1 through stage 4 chronic kidney disease, or unspecified chronic kidney disease: Secondary | ICD-10-CM | POA: Diagnosis not present

## 2019-02-27 DIAGNOSIS — Z1389 Encounter for screening for other disorder: Secondary | ICD-10-CM | POA: Diagnosis not present

## 2019-02-27 DIAGNOSIS — Z136 Encounter for screening for cardiovascular disorders: Secondary | ICD-10-CM | POA: Diagnosis not present

## 2019-02-27 DIAGNOSIS — Z0000 Encounter for general adult medical examination without abnormal findings: Secondary | ICD-10-CM | POA: Diagnosis not present

## 2019-02-27 DIAGNOSIS — I2510 Atherosclerotic heart disease of native coronary artery without angina pectoris: Secondary | ICD-10-CM | POA: Diagnosis not present

## 2019-03-07 DIAGNOSIS — H35373 Puckering of macula, bilateral: Secondary | ICD-10-CM | POA: Diagnosis not present

## 2019-03-07 DIAGNOSIS — H43823 Vitreomacular adhesion, bilateral: Secondary | ICD-10-CM | POA: Diagnosis not present

## 2019-03-07 DIAGNOSIS — H401123 Primary open-angle glaucoma, left eye, severe stage: Secondary | ICD-10-CM | POA: Diagnosis not present

## 2019-03-07 DIAGNOSIS — H401133 Primary open-angle glaucoma, bilateral, severe stage: Secondary | ICD-10-CM | POA: Diagnosis not present

## 2019-03-07 DIAGNOSIS — H401113 Primary open-angle glaucoma, right eye, severe stage: Secondary | ICD-10-CM | POA: Diagnosis not present

## 2019-03-07 DIAGNOSIS — H04123 Dry eye syndrome of bilateral lacrimal glands: Secondary | ICD-10-CM | POA: Diagnosis not present

## 2019-03-07 DIAGNOSIS — E119 Type 2 diabetes mellitus without complications: Secondary | ICD-10-CM | POA: Diagnosis not present

## 2019-03-11 ENCOUNTER — Ambulatory Visit: Payer: Medicare HMO | Attending: Internal Medicine

## 2019-03-11 DIAGNOSIS — Z23 Encounter for immunization: Secondary | ICD-10-CM | POA: Insufficient documentation

## 2019-03-11 NOTE — Progress Notes (Signed)
   Covid-19 Vaccination Clinic  Name:  Shelby Gay    MRN: [id number] DOB: November 09, 1930  03/11/2019  [name] was observed post Covid-19 immunization for 15 minutes without incidence. She was provided with Vaccine Information Sheet and instruction to access the V-Safe system.   [name] was instructed to call 911 with any severe reactions post vaccine: Marland Kitchen Difficulty breathing  . Swelling of your face and throat  . A fast heartbeat  . A bad rash all over your body  . Dizziness and weakness    Immunizations Administered    Name Date Dose VIS Date Route   Pfizer COVID-19 Vaccine 03/11/2019  9:35 AM 0.3 mL 12/23/2018 Intramuscular   Manufacturer: [place]   Lot: [id number]   [place]: [id number]

## 2019-03-13 ENCOUNTER — Other Ambulatory Visit: Payer: Self-pay | Admitting: Family Medicine

## 2019-03-13 DIAGNOSIS — N854 Malposition of uterus: Secondary | ICD-10-CM | POA: Diagnosis not present

## 2019-03-13 DIAGNOSIS — R109 Unspecified abdominal pain: Secondary | ICD-10-CM | POA: Diagnosis not present

## 2019-03-13 DIAGNOSIS — N858 Other specified noninflammatory disorders of uterus: Secondary | ICD-10-CM | POA: Diagnosis not present

## 2019-04-03 DIAGNOSIS — I10 Essential (primary) hypertension: Secondary | ICD-10-CM | POA: Diagnosis not present

## 2019-04-03 DIAGNOSIS — E039 Hypothyroidism, unspecified: Secondary | ICD-10-CM | POA: Diagnosis not present

## 2019-04-03 DIAGNOSIS — M8580 Other specified disorders of bone density and structure, unspecified site: Secondary | ICD-10-CM | POA: Diagnosis not present

## 2019-04-03 DIAGNOSIS — N184 Chronic kidney disease, stage 4 (severe): Secondary | ICD-10-CM | POA: Diagnosis not present

## 2019-04-03 DIAGNOSIS — E7800 Pure hypercholesterolemia, unspecified: Secondary | ICD-10-CM | POA: Diagnosis not present

## 2019-04-03 DIAGNOSIS — E1122 Type 2 diabetes mellitus with diabetic chronic kidney disease: Secondary | ICD-10-CM | POA: Diagnosis not present

## 2019-04-03 DIAGNOSIS — I129 Hypertensive chronic kidney disease with stage 1 through stage 4 chronic kidney disease, or unspecified chronic kidney disease: Secondary | ICD-10-CM | POA: Diagnosis not present

## 2019-04-03 DIAGNOSIS — D509 Iron deficiency anemia, unspecified: Secondary | ICD-10-CM | POA: Diagnosis not present

## 2019-04-03 DIAGNOSIS — I480 Paroxysmal atrial fibrillation: Secondary | ICD-10-CM | POA: Diagnosis not present

## 2019-04-05 ENCOUNTER — Ambulatory Visit: Payer: Medicare HMO | Attending: Internal Medicine

## 2019-04-05 DIAGNOSIS — Z23 Encounter for immunization: Secondary | ICD-10-CM

## 2019-04-05 NOTE — Progress Notes (Signed)
   Covid-19 Vaccination Clinic  Name:  Shelby Gay    MRN: [id number] DOB: [birth year]  04/05/2019  [name] was observed post Covid-19 immunization for 15 minutes without incident. She was provided with Vaccine Information Sheet and instruction to access the V-Safe system.   [name] was instructed to call 911 with any severe reactions post vaccine: Marland Kitchen Difficulty breathing  . Swelling of face and throat  . A fast heartbeat  . A bad rash all over body  . Dizziness and weakness   Immunizations Administered    Name Date Dose VIS Date Route   Pfizer COVID-19 Vaccine 04/05/2019  9:58 AM 0.3 mL 12/23/2018 Intramuscular   Manufacturer: [place]   Lot: [id number]   [place]: [id number]

## 2019-04-11 DIAGNOSIS — M19171 Post-traumatic osteoarthritis, right ankle and foot: Secondary | ICD-10-CM | POA: Diagnosis not present

## 2019-04-11 DIAGNOSIS — S93401A Sprain of unspecified ligament of right ankle, initial encounter: Secondary | ICD-10-CM | POA: Diagnosis not present

## 2019-04-18 DIAGNOSIS — E119 Type 2 diabetes mellitus without complications: Secondary | ICD-10-CM | POA: Diagnosis not present

## 2019-04-18 DIAGNOSIS — H43823 Vitreomacular adhesion, bilateral: Secondary | ICD-10-CM | POA: Diagnosis not present

## 2019-04-18 DIAGNOSIS — H04123 Dry eye syndrome of bilateral lacrimal glands: Secondary | ICD-10-CM | POA: Diagnosis not present

## 2019-04-18 DIAGNOSIS — H35373 Puckering of macula, bilateral: Secondary | ICD-10-CM | POA: Diagnosis not present

## 2019-04-18 DIAGNOSIS — H401133 Primary open-angle glaucoma, bilateral, severe stage: Secondary | ICD-10-CM | POA: Diagnosis not present

## 2019-05-16 DIAGNOSIS — E7800 Pure hypercholesterolemia, unspecified: Secondary | ICD-10-CM | POA: Diagnosis not present

## 2019-05-16 DIAGNOSIS — N184 Chronic kidney disease, stage 4 (severe): Secondary | ICD-10-CM | POA: Diagnosis not present

## 2019-05-16 DIAGNOSIS — Z952 Presence of prosthetic heart valve: Secondary | ICD-10-CM | POA: Diagnosis not present

## 2019-05-16 DIAGNOSIS — E1122 Type 2 diabetes mellitus with diabetic chronic kidney disease: Secondary | ICD-10-CM | POA: Diagnosis not present

## 2019-05-16 DIAGNOSIS — I129 Hypertensive chronic kidney disease with stage 1 through stage 4 chronic kidney disease, or unspecified chronic kidney disease: Secondary | ICD-10-CM | POA: Diagnosis not present

## 2019-05-16 DIAGNOSIS — I4891 Unspecified atrial fibrillation: Secondary | ICD-10-CM | POA: Diagnosis not present

## 2019-06-26 DIAGNOSIS — I129 Hypertensive chronic kidney disease with stage 1 through stage 4 chronic kidney disease, or unspecified chronic kidney disease: Secondary | ICD-10-CM | POA: Diagnosis not present

## 2019-06-26 DIAGNOSIS — I2510 Atherosclerotic heart disease of native coronary artery without angina pectoris: Secondary | ICD-10-CM | POA: Diagnosis not present

## 2019-06-26 DIAGNOSIS — E7800 Pure hypercholesterolemia, unspecified: Secondary | ICD-10-CM | POA: Diagnosis not present

## 2019-06-26 DIAGNOSIS — M8580 Other specified disorders of bone density and structure, unspecified site: Secondary | ICD-10-CM | POA: Diagnosis not present

## 2019-06-26 DIAGNOSIS — I10 Essential (primary) hypertension: Secondary | ICD-10-CM | POA: Diagnosis not present

## 2019-06-26 DIAGNOSIS — E039 Hypothyroidism, unspecified: Secondary | ICD-10-CM | POA: Diagnosis not present

## 2019-06-26 DIAGNOSIS — D509 Iron deficiency anemia, unspecified: Secondary | ICD-10-CM | POA: Diagnosis not present

## 2019-06-26 DIAGNOSIS — E1122 Type 2 diabetes mellitus with diabetic chronic kidney disease: Secondary | ICD-10-CM | POA: Diagnosis not present

## 2019-06-26 DIAGNOSIS — I480 Paroxysmal atrial fibrillation: Secondary | ICD-10-CM | POA: Diagnosis not present

## 2019-07-17 DIAGNOSIS — Z45018 Encounter for adjustment and management of other part of cardiac pacemaker: Secondary | ICD-10-CM | POA: Diagnosis not present

## 2019-07-19 DIAGNOSIS — E119 Type 2 diabetes mellitus without complications: Secondary | ICD-10-CM | POA: Diagnosis not present

## 2019-07-19 DIAGNOSIS — H43823 Vitreomacular adhesion, bilateral: Secondary | ICD-10-CM | POA: Diagnosis not present

## 2019-07-19 DIAGNOSIS — H401133 Primary open-angle glaucoma, bilateral, severe stage: Secondary | ICD-10-CM | POA: Diagnosis not present

## 2019-07-19 DIAGNOSIS — H04123 Dry eye syndrome of bilateral lacrimal glands: Secondary | ICD-10-CM | POA: Diagnosis not present

## 2019-07-19 DIAGNOSIS — H35373 Puckering of macula, bilateral: Secondary | ICD-10-CM | POA: Diagnosis not present

## 2019-07-28 DIAGNOSIS — N390 Urinary tract infection, site not specified: Secondary | ICD-10-CM | POA: Diagnosis not present

## 2019-07-28 DIAGNOSIS — R300 Dysuria: Secondary | ICD-10-CM | POA: Diagnosis not present

## 2019-08-28 DIAGNOSIS — E039 Hypothyroidism, unspecified: Secondary | ICD-10-CM | POA: Diagnosis not present

## 2019-08-28 DIAGNOSIS — R1031 Right lower quadrant pain: Secondary | ICD-10-CM | POA: Diagnosis not present

## 2019-08-28 DIAGNOSIS — I480 Paroxysmal atrial fibrillation: Secondary | ICD-10-CM | POA: Diagnosis not present

## 2019-08-28 DIAGNOSIS — E7800 Pure hypercholesterolemia, unspecified: Secondary | ICD-10-CM | POA: Diagnosis not present

## 2019-08-28 DIAGNOSIS — N183 Chronic kidney disease, stage 3 unspecified: Secondary | ICD-10-CM | POA: Diagnosis not present

## 2019-08-28 DIAGNOSIS — L719 Rosacea, unspecified: Secondary | ICD-10-CM | POA: Diagnosis not present

## 2019-08-28 DIAGNOSIS — I2510 Atherosclerotic heart disease of native coronary artery without angina pectoris: Secondary | ICD-10-CM | POA: Diagnosis not present

## 2019-08-28 DIAGNOSIS — D509 Iron deficiency anemia, unspecified: Secondary | ICD-10-CM | POA: Diagnosis not present

## 2019-08-28 DIAGNOSIS — D692 Other nonthrombocytopenic purpura: Secondary | ICD-10-CM | POA: Diagnosis not present

## 2019-08-28 DIAGNOSIS — E1122 Type 2 diabetes mellitus with diabetic chronic kidney disease: Secondary | ICD-10-CM | POA: Diagnosis not present

## 2019-08-28 DIAGNOSIS — H6590 Unspecified nonsuppurative otitis media, unspecified ear: Secondary | ICD-10-CM | POA: Diagnosis not present

## 2019-08-28 DIAGNOSIS — I129 Hypertensive chronic kidney disease with stage 1 through stage 4 chronic kidney disease, or unspecified chronic kidney disease: Secondary | ICD-10-CM | POA: Diagnosis not present

## 2019-09-11 DIAGNOSIS — I495 Sick sinus syndrome: Secondary | ICD-10-CM | POA: Diagnosis not present

## 2019-09-11 DIAGNOSIS — I498 Other specified cardiac arrhythmias: Secondary | ICD-10-CM | POA: Diagnosis not present

## 2019-09-11 DIAGNOSIS — I25118 Atherosclerotic heart disease of native coronary artery with other forms of angina pectoris: Secondary | ICD-10-CM | POA: Diagnosis not present

## 2019-09-11 DIAGNOSIS — R9431 Abnormal electrocardiogram [ECG] [EKG]: Secondary | ICD-10-CM | POA: Diagnosis not present

## 2019-09-11 DIAGNOSIS — I480 Paroxysmal atrial fibrillation: Secondary | ICD-10-CM | POA: Diagnosis not present

## 2019-09-11 DIAGNOSIS — Z952 Presence of prosthetic heart valve: Secondary | ICD-10-CM | POA: Diagnosis not present

## 2019-09-11 DIAGNOSIS — I10 Essential (primary) hypertension: Secondary | ICD-10-CM | POA: Diagnosis not present

## 2019-09-11 DIAGNOSIS — Z45018 Encounter for adjustment and management of other part of cardiac pacemaker: Secondary | ICD-10-CM | POA: Diagnosis not present

## 2019-09-13 DIAGNOSIS — I498 Other specified cardiac arrhythmias: Secondary | ICD-10-CM | POA: Diagnosis not present

## 2019-09-26 DIAGNOSIS — T85848D Pain due to other internal prosthetic devices, implants and grafts, subsequent encounter: Secondary | ICD-10-CM | POA: Diagnosis not present

## 2019-09-26 DIAGNOSIS — M25571 Pain in right ankle and joints of right foot: Secondary | ICD-10-CM | POA: Diagnosis not present

## 2019-10-05 DIAGNOSIS — H401131 Primary open-angle glaucoma, bilateral, mild stage: Secondary | ICD-10-CM | POA: Diagnosis not present

## 2019-10-05 DIAGNOSIS — Z950 Presence of cardiac pacemaker: Secondary | ICD-10-CM | POA: Diagnosis not present

## 2019-10-05 DIAGNOSIS — R519 Headache, unspecified: Secondary | ICD-10-CM | POA: Diagnosis not present

## 2019-10-09 ENCOUNTER — Other Ambulatory Visit: Payer: Self-pay | Admitting: Family Medicine

## 2019-10-09 DIAGNOSIS — R519 Headache, unspecified: Secondary | ICD-10-CM

## 2019-10-11 ENCOUNTER — Ambulatory Visit
Admission: RE | Admit: 2019-10-11 | Discharge: 2019-10-11 | Disposition: A | Discharge status: Home / Self Care | Payer: Medicare HMO | Source: Ambulatory Visit | Attending: Family Medicine | Admitting: Family Medicine

## 2019-10-11 DIAGNOSIS — R519 Headache, unspecified: Secondary | ICD-10-CM

## 2019-10-17 ENCOUNTER — Other Ambulatory Visit: Payer: Medicare HMO

## 2019-10-27 DIAGNOSIS — I080 Rheumatic disorders of both mitral and aortic valves: Secondary | ICD-10-CM | POA: Diagnosis not present

## 2019-10-27 DIAGNOSIS — Z952 Presence of prosthetic heart valve: Secondary | ICD-10-CM | POA: Diagnosis not present

## 2019-12-04 DIAGNOSIS — M19171 Post-traumatic osteoarthritis, right ankle and foot: Secondary | ICD-10-CM | POA: Diagnosis not present

## 2019-12-04 DIAGNOSIS — M65171 Other infective (teno)synovitis, right ankle and foot: Secondary | ICD-10-CM | POA: Diagnosis not present

## 2019-12-29 DIAGNOSIS — E7800 Pure hypercholesterolemia, unspecified: Secondary | ICD-10-CM | POA: Diagnosis not present

## 2019-12-29 DIAGNOSIS — M8580 Other specified disorders of bone density and structure, unspecified site: Secondary | ICD-10-CM | POA: Diagnosis not present

## 2019-12-29 DIAGNOSIS — I129 Hypertensive chronic kidney disease with stage 1 through stage 4 chronic kidney disease, or unspecified chronic kidney disease: Secondary | ICD-10-CM | POA: Diagnosis not present

## 2019-12-29 DIAGNOSIS — E039 Hypothyroidism, unspecified: Secondary | ICD-10-CM | POA: Diagnosis not present

## 2019-12-29 DIAGNOSIS — I10 Essential (primary) hypertension: Secondary | ICD-10-CM | POA: Diagnosis not present

## 2019-12-29 DIAGNOSIS — I480 Paroxysmal atrial fibrillation: Secondary | ICD-10-CM | POA: Diagnosis not present

## 2019-12-29 DIAGNOSIS — D509 Iron deficiency anemia, unspecified: Secondary | ICD-10-CM | POA: Diagnosis not present

## 2019-12-29 DIAGNOSIS — I2510 Atherosclerotic heart disease of native coronary artery without angina pectoris: Secondary | ICD-10-CM | POA: Diagnosis not present

## 2019-12-29 DIAGNOSIS — E1122 Type 2 diabetes mellitus with diabetic chronic kidney disease: Secondary | ICD-10-CM | POA: Diagnosis not present

## 2020-01-09 DIAGNOSIS — H401133 Primary open-angle glaucoma, bilateral, severe stage: Secondary | ICD-10-CM | POA: Diagnosis not present

## 2020-02-28 DIAGNOSIS — I480 Paroxysmal atrial fibrillation: Secondary | ICD-10-CM | POA: Diagnosis not present

## 2020-02-28 DIAGNOSIS — E7800 Pure hypercholesterolemia, unspecified: Secondary | ICD-10-CM | POA: Diagnosis not present

## 2020-02-28 DIAGNOSIS — I129 Hypertensive chronic kidney disease with stage 1 through stage 4 chronic kidney disease, or unspecified chronic kidney disease: Secondary | ICD-10-CM | POA: Diagnosis not present

## 2020-02-28 DIAGNOSIS — E039 Hypothyroidism, unspecified: Secondary | ICD-10-CM | POA: Diagnosis not present

## 2020-02-28 DIAGNOSIS — N1832 Chronic kidney disease, stage 3b: Secondary | ICD-10-CM | POA: Diagnosis not present

## 2020-02-28 DIAGNOSIS — Z0000 Encounter for general adult medical examination without abnormal findings: Secondary | ICD-10-CM | POA: Diagnosis not present

## 2020-02-28 DIAGNOSIS — M25511 Pain in right shoulder: Secondary | ICD-10-CM | POA: Diagnosis not present

## 2020-02-28 DIAGNOSIS — I2510 Atherosclerotic heart disease of native coronary artery without angina pectoris: Secondary | ICD-10-CM | POA: Diagnosis not present

## 2020-02-28 DIAGNOSIS — H6590 Unspecified nonsuppurative otitis media, unspecified ear: Secondary | ICD-10-CM | POA: Diagnosis not present

## 2020-02-28 DIAGNOSIS — D692 Other nonthrombocytopenic purpura: Secondary | ICD-10-CM | POA: Diagnosis not present

## 2020-02-28 DIAGNOSIS — E1122 Type 2 diabetes mellitus with diabetic chronic kidney disease: Secondary | ICD-10-CM | POA: Diagnosis not present

## 2020-02-28 DIAGNOSIS — D509 Iron deficiency anemia, unspecified: Secondary | ICD-10-CM | POA: Diagnosis not present

## 2020-02-29 DIAGNOSIS — I480 Paroxysmal atrial fibrillation: Secondary | ICD-10-CM | POA: Diagnosis not present

## 2020-03-15 DIAGNOSIS — I10 Essential (primary) hypertension: Secondary | ICD-10-CM | POA: Diagnosis not present

## 2020-03-15 DIAGNOSIS — I4891 Unspecified atrial fibrillation: Secondary | ICD-10-CM | POA: Diagnosis not present

## 2020-03-15 DIAGNOSIS — Z952 Presence of prosthetic heart valve: Secondary | ICD-10-CM | POA: Diagnosis not present

## 2020-03-15 DIAGNOSIS — I480 Paroxysmal atrial fibrillation: Secondary | ICD-10-CM | POA: Diagnosis not present

## 2020-03-15 DIAGNOSIS — I25118 Atherosclerotic heart disease of native coronary artery with other forms of angina pectoris: Secondary | ICD-10-CM | POA: Diagnosis not present

## 2020-03-15 DIAGNOSIS — Z45018 Encounter for adjustment and management of other part of cardiac pacemaker: Secondary | ICD-10-CM | POA: Diagnosis not present

## 2020-04-15 DIAGNOSIS — H401133 Primary open-angle glaucoma, bilateral, severe stage: Secondary | ICD-10-CM | POA: Diagnosis not present

## 2020-04-15 DIAGNOSIS — I4891 Unspecified atrial fibrillation: Secondary | ICD-10-CM | POA: Diagnosis not present

## 2020-04-15 DIAGNOSIS — Z45018 Encounter for adjustment and management of other part of cardiac pacemaker: Secondary | ICD-10-CM | POA: Diagnosis not present

## 2020-04-16 DIAGNOSIS — Z7901 Long term (current) use of anticoagulants: Secondary | ICD-10-CM | POA: Diagnosis not present

## 2020-04-16 DIAGNOSIS — R112 Nausea with vomiting, unspecified: Secondary | ICD-10-CM | POA: Diagnosis not present

## 2020-04-16 DIAGNOSIS — K449 Diaphragmatic hernia without obstruction or gangrene: Secondary | ICD-10-CM | POA: Diagnosis not present

## 2020-04-16 DIAGNOSIS — K7460 Unspecified cirrhosis of liver: Secondary | ICD-10-CM | POA: Diagnosis not present

## 2020-04-16 DIAGNOSIS — N390 Urinary tract infection, site not specified: Secondary | ICD-10-CM | POA: Diagnosis not present

## 2020-04-16 DIAGNOSIS — N12 Tubulo-interstitial nephritis, not specified as acute or chronic: Secondary | ICD-10-CM | POA: Diagnosis not present

## 2020-04-16 DIAGNOSIS — G319 Degenerative disease of nervous system, unspecified: Secondary | ICD-10-CM | POA: Diagnosis not present

## 2020-04-16 DIAGNOSIS — Z79899 Other long term (current) drug therapy: Secondary | ICD-10-CM | POA: Diagnosis not present

## 2020-04-16 DIAGNOSIS — I10 Essential (primary) hypertension: Secondary | ICD-10-CM | POA: Diagnosis not present

## 2020-04-16 DIAGNOSIS — K7689 Other specified diseases of liver: Secondary | ICD-10-CM | POA: Diagnosis not present

## 2020-04-16 DIAGNOSIS — B9620 Unspecified Escherichia coli [E. coli] as the cause of diseases classified elsewhere: Secondary | ICD-10-CM | POA: Diagnosis not present

## 2020-04-16 DIAGNOSIS — E039 Hypothyroidism, unspecified: Secondary | ICD-10-CM | POA: Diagnosis not present

## 2020-04-16 DIAGNOSIS — R6510 Systemic inflammatory response syndrome (SIRS) of non-infectious origin without acute organ dysfunction: Secondary | ICD-10-CM | POA: Diagnosis not present

## 2020-04-16 DIAGNOSIS — I129 Hypertensive chronic kidney disease with stage 1 through stage 4 chronic kidney disease, or unspecified chronic kidney disease: Secondary | ICD-10-CM | POA: Diagnosis not present

## 2020-04-16 DIAGNOSIS — N1330 Unspecified hydronephrosis: Secondary | ICD-10-CM | POA: Diagnosis not present

## 2020-04-16 DIAGNOSIS — N179 Acute kidney failure, unspecified: Secondary | ICD-10-CM | POA: Diagnosis not present

## 2020-04-16 DIAGNOSIS — N184 Chronic kidney disease, stage 4 (severe): Secondary | ICD-10-CM | POA: Diagnosis not present

## 2020-04-16 DIAGNOSIS — A419 Sepsis, unspecified organism: Secondary | ICD-10-CM | POA: Diagnosis not present

## 2020-04-16 DIAGNOSIS — R600 Localized edema: Secondary | ICD-10-CM | POA: Diagnosis not present

## 2020-04-16 DIAGNOSIS — R9082 White matter disease, unspecified: Secondary | ICD-10-CM | POA: Diagnosis not present

## 2020-04-16 DIAGNOSIS — N189 Chronic kidney disease, unspecified: Secondary | ICD-10-CM | POA: Diagnosis not present

## 2020-04-16 DIAGNOSIS — I480 Paroxysmal atrial fibrillation: Secondary | ICD-10-CM | POA: Diagnosis not present

## 2020-04-16 DIAGNOSIS — R509 Fever, unspecified: Secondary | ICD-10-CM | POA: Diagnosis not present

## 2020-04-16 DIAGNOSIS — K8020 Calculus of gallbladder without cholecystitis without obstruction: Secondary | ICD-10-CM | POA: Diagnosis not present

## 2020-04-16 DIAGNOSIS — I4891 Unspecified atrial fibrillation: Secondary | ICD-10-CM | POA: Diagnosis not present

## 2020-04-16 DIAGNOSIS — R7881 Bacteremia: Secondary | ICD-10-CM | POA: Diagnosis not present

## 2020-04-16 DIAGNOSIS — H401133 Primary open-angle glaucoma, bilateral, severe stage: Secondary | ICD-10-CM | POA: Diagnosis not present

## 2020-04-16 DIAGNOSIS — Z20822 Contact with and (suspected) exposure to covid-19: Secondary | ICD-10-CM | POA: Diagnosis not present

## 2020-04-16 DIAGNOSIS — N10 Acute tubulo-interstitial nephritis: Secondary | ICD-10-CM | POA: Diagnosis not present

## 2020-04-16 DIAGNOSIS — H35373 Puckering of macula, bilateral: Secondary | ICD-10-CM | POA: Diagnosis not present

## 2020-04-16 DIAGNOSIS — H43823 Vitreomacular adhesion, bilateral: Secondary | ICD-10-CM | POA: Diagnosis not present

## 2020-04-16 DIAGNOSIS — Z885 Allergy status to narcotic agent status: Secondary | ICD-10-CM | POA: Diagnosis not present

## 2020-04-16 DIAGNOSIS — R519 Headache, unspecified: Secondary | ICD-10-CM | POA: Diagnosis not present

## 2020-04-16 DIAGNOSIS — N132 Hydronephrosis with renal and ureteral calculous obstruction: Secondary | ICD-10-CM | POA: Diagnosis not present

## 2020-04-16 DIAGNOSIS — A4151 Sepsis due to Escherichia coli [E. coli]: Secondary | ICD-10-CM | POA: Diagnosis not present

## 2020-04-16 DIAGNOSIS — Z888 Allergy status to other drugs, medicaments and biological substances status: Secondary | ICD-10-CM | POA: Diagnosis not present

## 2020-04-16 DIAGNOSIS — R7982 Elevated C-reactive protein (CRP): Secondary | ICD-10-CM | POA: Diagnosis not present

## 2020-04-17 DIAGNOSIS — N12 Tubulo-interstitial nephritis, not specified as acute or chronic: Secondary | ICD-10-CM | POA: Diagnosis not present

## 2020-04-17 DIAGNOSIS — I4891 Unspecified atrial fibrillation: Secondary | ICD-10-CM | POA: Diagnosis not present

## 2020-04-17 DIAGNOSIS — N179 Acute kidney failure, unspecified: Secondary | ICD-10-CM | POA: Diagnosis not present

## 2020-04-17 DIAGNOSIS — E039 Hypothyroidism, unspecified: Secondary | ICD-10-CM | POA: Diagnosis not present

## 2020-04-17 DIAGNOSIS — N184 Chronic kidney disease, stage 4 (severe): Secondary | ICD-10-CM | POA: Diagnosis not present

## 2020-04-17 DIAGNOSIS — I10 Essential (primary) hypertension: Secondary | ICD-10-CM | POA: Diagnosis not present

## 2020-04-17 DIAGNOSIS — R7881 Bacteremia: Secondary | ICD-10-CM | POA: Diagnosis not present

## 2020-04-18 DIAGNOSIS — E039 Hypothyroidism, unspecified: Secondary | ICD-10-CM | POA: Diagnosis not present

## 2020-04-18 DIAGNOSIS — N184 Chronic kidney disease, stage 4 (severe): Secondary | ICD-10-CM | POA: Diagnosis not present

## 2020-04-18 DIAGNOSIS — I10 Essential (primary) hypertension: Secondary | ICD-10-CM | POA: Diagnosis not present

## 2020-04-18 DIAGNOSIS — I4891 Unspecified atrial fibrillation: Secondary | ICD-10-CM | POA: Diagnosis not present

## 2020-04-18 DIAGNOSIS — N179 Acute kidney failure, unspecified: Secondary | ICD-10-CM | POA: Diagnosis not present

## 2020-04-18 DIAGNOSIS — N12 Tubulo-interstitial nephritis, not specified as acute or chronic: Secondary | ICD-10-CM | POA: Diagnosis not present

## 2020-04-18 DIAGNOSIS — R7881 Bacteremia: Secondary | ICD-10-CM | POA: Diagnosis not present

## 2020-04-19 DIAGNOSIS — N184 Chronic kidney disease, stage 4 (severe): Secondary | ICD-10-CM | POA: Diagnosis not present

## 2020-04-19 DIAGNOSIS — H35373 Puckering of macula, bilateral: Secondary | ICD-10-CM | POA: Diagnosis not present

## 2020-04-19 DIAGNOSIS — Z79899 Other long term (current) drug therapy: Secondary | ICD-10-CM | POA: Diagnosis not present

## 2020-04-19 DIAGNOSIS — H43823 Vitreomacular adhesion, bilateral: Secondary | ICD-10-CM | POA: Diagnosis not present

## 2020-04-19 DIAGNOSIS — R9082 White matter disease, unspecified: Secondary | ICD-10-CM | POA: Diagnosis not present

## 2020-04-19 DIAGNOSIS — E039 Hypothyroidism, unspecified: Secondary | ICD-10-CM | POA: Diagnosis not present

## 2020-04-19 DIAGNOSIS — R7982 Elevated C-reactive protein (CRP): Secondary | ICD-10-CM | POA: Diagnosis not present

## 2020-04-19 DIAGNOSIS — I129 Hypertensive chronic kidney disease with stage 1 through stage 4 chronic kidney disease, or unspecified chronic kidney disease: Secondary | ICD-10-CM | POA: Diagnosis not present

## 2020-04-19 DIAGNOSIS — G319 Degenerative disease of nervous system, unspecified: Secondary | ICD-10-CM | POA: Diagnosis not present

## 2020-04-19 DIAGNOSIS — I10 Essential (primary) hypertension: Secondary | ICD-10-CM | POA: Diagnosis not present

## 2020-04-19 DIAGNOSIS — H401133 Primary open-angle glaucoma, bilateral, severe stage: Secondary | ICD-10-CM | POA: Diagnosis not present

## 2020-04-19 DIAGNOSIS — N179 Acute kidney failure, unspecified: Secondary | ICD-10-CM | POA: Diagnosis not present

## 2020-04-19 DIAGNOSIS — Z885 Allergy status to narcotic agent status: Secondary | ICD-10-CM | POA: Diagnosis not present

## 2020-04-19 DIAGNOSIS — Z7901 Long term (current) use of anticoagulants: Secondary | ICD-10-CM | POA: Diagnosis not present

## 2020-04-19 DIAGNOSIS — N189 Chronic kidney disease, unspecified: Secondary | ICD-10-CM | POA: Diagnosis not present

## 2020-04-19 DIAGNOSIS — N12 Tubulo-interstitial nephritis, not specified as acute or chronic: Secondary | ICD-10-CM | POA: Diagnosis not present

## 2020-04-19 DIAGNOSIS — Z888 Allergy status to other drugs, medicaments and biological substances status: Secondary | ICD-10-CM | POA: Diagnosis not present

## 2020-04-19 DIAGNOSIS — R600 Localized edema: Secondary | ICD-10-CM | POA: Diagnosis not present

## 2020-04-19 DIAGNOSIS — R7881 Bacteremia: Secondary | ICD-10-CM | POA: Diagnosis not present

## 2020-04-19 DIAGNOSIS — R519 Headache, unspecified: Secondary | ICD-10-CM | POA: Diagnosis not present

## 2020-04-19 DIAGNOSIS — I4891 Unspecified atrial fibrillation: Secondary | ICD-10-CM | POA: Diagnosis not present

## 2020-04-24 DIAGNOSIS — N10 Acute tubulo-interstitial nephritis: Secondary | ICD-10-CM | POA: Diagnosis not present

## 2020-05-02 DIAGNOSIS — M19071 Primary osteoarthritis, right ankle and foot: Secondary | ICD-10-CM | POA: Diagnosis not present

## 2020-05-02 DIAGNOSIS — S93401A Sprain of unspecified ligament of right ankle, initial encounter: Secondary | ICD-10-CM | POA: Diagnosis not present

## 2020-05-07 DIAGNOSIS — R519 Headache, unspecified: Secondary | ICD-10-CM | POA: Diagnosis not present

## 2020-05-07 DIAGNOSIS — N10 Acute tubulo-interstitial nephritis: Secondary | ICD-10-CM | POA: Diagnosis not present

## 2020-05-20 DIAGNOSIS — I495 Sick sinus syndrome: Secondary | ICD-10-CM | POA: Diagnosis not present

## 2020-05-20 DIAGNOSIS — Z45018 Encounter for adjustment and management of other part of cardiac pacemaker: Secondary | ICD-10-CM | POA: Diagnosis not present

## 2020-05-28 DIAGNOSIS — S39012A Strain of muscle, fascia and tendon of lower back, initial encounter: Secondary | ICD-10-CM | POA: Diagnosis not present

## 2020-06-03 DIAGNOSIS — H401133 Primary open-angle glaucoma, bilateral, severe stage: Secondary | ICD-10-CM | POA: Diagnosis not present

## 2020-06-05 ENCOUNTER — Other Ambulatory Visit: Payer: Self-pay | Admitting: Family Medicine

## 2020-06-05 ENCOUNTER — Ambulatory Visit
Admission: RE | Admit: 2020-06-05 | Discharge: 2020-06-05 | Disposition: A | Discharge status: Home / Self Care | Payer: Medicare HMO | Source: Ambulatory Visit | Attending: Family Medicine | Admitting: Family Medicine

## 2020-06-05 ENCOUNTER — Other Ambulatory Visit: Payer: Self-pay

## 2020-06-05 DIAGNOSIS — R52 Pain, unspecified: Secondary | ICD-10-CM

## 2020-06-05 DIAGNOSIS — M545 Low back pain, unspecified: Secondary | ICD-10-CM | POA: Diagnosis not present

## 2020-07-09 DIAGNOSIS — G5701 Lesion of sciatic nerve, right lower limb: Secondary | ICD-10-CM | POA: Diagnosis not present

## 2020-07-09 DIAGNOSIS — M7918 Myalgia, other site: Secondary | ICD-10-CM | POA: Diagnosis not present

## 2020-07-09 DIAGNOSIS — R030 Elevated blood-pressure reading, without diagnosis of hypertension: Secondary | ICD-10-CM | POA: Diagnosis not present

## 2020-07-10 DIAGNOSIS — E039 Hypothyroidism, unspecified: Secondary | ICD-10-CM | POA: Diagnosis not present

## 2020-07-10 DIAGNOSIS — D509 Iron deficiency anemia, unspecified: Secondary | ICD-10-CM | POA: Diagnosis not present

## 2020-07-10 DIAGNOSIS — M8580 Other specified disorders of bone density and structure, unspecified site: Secondary | ICD-10-CM | POA: Diagnosis not present

## 2020-07-10 DIAGNOSIS — I2510 Atherosclerotic heart disease of native coronary artery without angina pectoris: Secondary | ICD-10-CM | POA: Diagnosis not present

## 2020-07-10 DIAGNOSIS — E7800 Pure hypercholesterolemia, unspecified: Secondary | ICD-10-CM | POA: Diagnosis not present

## 2020-07-10 DIAGNOSIS — E1122 Type 2 diabetes mellitus with diabetic chronic kidney disease: Secondary | ICD-10-CM | POA: Diagnosis not present

## 2020-07-10 DIAGNOSIS — I129 Hypertensive chronic kidney disease with stage 1 through stage 4 chronic kidney disease, or unspecified chronic kidney disease: Secondary | ICD-10-CM | POA: Diagnosis not present

## 2020-07-10 DIAGNOSIS — I480 Paroxysmal atrial fibrillation: Secondary | ICD-10-CM | POA: Diagnosis not present

## 2020-07-10 DIAGNOSIS — I10 Essential (primary) hypertension: Secondary | ICD-10-CM | POA: Diagnosis not present

## 2020-07-15 DIAGNOSIS — Z950 Presence of cardiac pacemaker: Secondary | ICD-10-CM | POA: Diagnosis not present

## 2020-08-01 DIAGNOSIS — I480 Paroxysmal atrial fibrillation: Secondary | ICD-10-CM | POA: Diagnosis not present

## 2020-08-01 DIAGNOSIS — E039 Hypothyroidism, unspecified: Secondary | ICD-10-CM | POA: Diagnosis not present

## 2020-08-01 DIAGNOSIS — I129 Hypertensive chronic kidney disease with stage 1 through stage 4 chronic kidney disease, or unspecified chronic kidney disease: Secondary | ICD-10-CM | POA: Diagnosis not present

## 2020-08-01 DIAGNOSIS — I2510 Atherosclerotic heart disease of native coronary artery without angina pectoris: Secondary | ICD-10-CM | POA: Diagnosis not present

## 2020-08-01 DIAGNOSIS — E7800 Pure hypercholesterolemia, unspecified: Secondary | ICD-10-CM | POA: Diagnosis not present

## 2020-08-01 DIAGNOSIS — D509 Iron deficiency anemia, unspecified: Secondary | ICD-10-CM | POA: Diagnosis not present

## 2020-08-01 DIAGNOSIS — E1122 Type 2 diabetes mellitus with diabetic chronic kidney disease: Secondary | ICD-10-CM | POA: Diagnosis not present

## 2020-08-01 DIAGNOSIS — M8580 Other specified disorders of bone density and structure, unspecified site: Secondary | ICD-10-CM | POA: Diagnosis not present

## 2020-08-01 DIAGNOSIS — I10 Essential (primary) hypertension: Secondary | ICD-10-CM | POA: Diagnosis not present

## 2020-08-24 DIAGNOSIS — Z79899 Other long term (current) drug therapy: Secondary | ICD-10-CM | POA: Diagnosis not present

## 2020-08-24 DIAGNOSIS — M25571 Pain in right ankle and joints of right foot: Secondary | ICD-10-CM | POA: Diagnosis not present

## 2020-08-24 DIAGNOSIS — S42024A Nondisplaced fracture of shaft of right clavicle, initial encounter for closed fracture: Secondary | ICD-10-CM | POA: Diagnosis not present

## 2020-08-24 DIAGNOSIS — S0990XA Unspecified injury of head, initial encounter: Secondary | ICD-10-CM | POA: Diagnosis not present

## 2020-08-24 DIAGNOSIS — Z888 Allergy status to other drugs, medicaments and biological substances status: Secondary | ICD-10-CM | POA: Diagnosis not present

## 2020-08-24 DIAGNOSIS — M7731 Calcaneal spur, right foot: Secondary | ICD-10-CM | POA: Diagnosis not present

## 2020-08-24 DIAGNOSIS — M25511 Pain in right shoulder: Secondary | ICD-10-CM | POA: Diagnosis not present

## 2020-08-24 DIAGNOSIS — S199XXA Unspecified injury of neck, initial encounter: Secondary | ICD-10-CM | POA: Diagnosis not present

## 2020-08-24 DIAGNOSIS — G8911 Acute pain due to trauma: Secondary | ICD-10-CM | POA: Diagnosis not present

## 2020-08-24 DIAGNOSIS — I4891 Unspecified atrial fibrillation: Secondary | ICD-10-CM | POA: Diagnosis not present

## 2020-08-24 DIAGNOSIS — S0181XA Laceration without foreign body of other part of head, initial encounter: Secondary | ICD-10-CM | POA: Diagnosis not present

## 2020-08-24 DIAGNOSIS — S42021A Displaced fracture of shaft of right clavicle, initial encounter for closed fracture: Secondary | ICD-10-CM | POA: Diagnosis not present

## 2020-08-24 DIAGNOSIS — S99921A Unspecified injury of right foot, initial encounter: Secondary | ICD-10-CM | POA: Diagnosis not present

## 2020-08-24 DIAGNOSIS — I129 Hypertensive chronic kidney disease with stage 1 through stage 4 chronic kidney disease, or unspecified chronic kidney disease: Secondary | ICD-10-CM | POA: Diagnosis not present

## 2020-08-24 DIAGNOSIS — N189 Chronic kidney disease, unspecified: Secondary | ICD-10-CM | POA: Diagnosis not present

## 2020-08-24 DIAGNOSIS — Z885 Allergy status to narcotic agent status: Secondary | ICD-10-CM | POA: Diagnosis not present

## 2020-08-29 DIAGNOSIS — M25511 Pain in right shoulder: Secondary | ICD-10-CM | POA: Diagnosis not present

## 2020-09-02 DIAGNOSIS — Z9181 History of falling: Secondary | ICD-10-CM | POA: Diagnosis not present

## 2020-09-02 DIAGNOSIS — S0191XD Laceration without foreign body of unspecified part of head, subsequent encounter: Secondary | ICD-10-CM | POA: Diagnosis not present

## 2020-09-02 DIAGNOSIS — M1710 Unilateral primary osteoarthritis, unspecified knee: Secondary | ICD-10-CM | POA: Diagnosis not present

## 2020-09-02 DIAGNOSIS — I480 Paroxysmal atrial fibrillation: Secondary | ICD-10-CM | POA: Diagnosis not present

## 2020-09-02 DIAGNOSIS — R2681 Unsteadiness on feet: Secondary | ICD-10-CM | POA: Diagnosis not present

## 2020-09-06 DIAGNOSIS — H401133 Primary open-angle glaucoma, bilateral, severe stage: Secondary | ICD-10-CM | POA: Diagnosis not present

## 2020-09-09 DIAGNOSIS — H401133 Primary open-angle glaucoma, bilateral, severe stage: Secondary | ICD-10-CM | POA: Diagnosis not present

## 2020-09-09 DIAGNOSIS — G8929 Other chronic pain: Secondary | ICD-10-CM | POA: Diagnosis not present

## 2020-09-09 DIAGNOSIS — H04123 Dry eye syndrome of bilateral lacrimal glands: Secondary | ICD-10-CM | POA: Diagnosis not present

## 2020-09-09 DIAGNOSIS — E119 Type 2 diabetes mellitus without complications: Secondary | ICD-10-CM | POA: Diagnosis not present

## 2020-09-09 DIAGNOSIS — G4440 Drug-induced headache, not elsewhere classified, not intractable: Secondary | ICD-10-CM | POA: Diagnosis not present

## 2020-09-09 DIAGNOSIS — R519 Headache, unspecified: Secondary | ICD-10-CM | POA: Diagnosis not present

## 2020-09-09 DIAGNOSIS — Z961 Presence of intraocular lens: Secondary | ICD-10-CM | POA: Diagnosis not present

## 2020-09-12 DIAGNOSIS — R110 Nausea: Secondary | ICD-10-CM | POA: Diagnosis not present

## 2020-09-12 DIAGNOSIS — K2970 Gastritis, unspecified, without bleeding: Secondary | ICD-10-CM | POA: Diagnosis not present

## 2020-09-12 DIAGNOSIS — G4700 Insomnia, unspecified: Secondary | ICD-10-CM | POA: Diagnosis not present

## 2020-09-13 ENCOUNTER — Ambulatory Visit: Payer: Medicare HMO | Admitting: Rehabilitative and Restorative Service Providers"

## 2020-09-13 ENCOUNTER — Other Ambulatory Visit: Payer: Self-pay

## 2020-09-13 DIAGNOSIS — R2689 Other abnormalities of gait and mobility: Secondary | ICD-10-CM

## 2020-09-13 DIAGNOSIS — M6281 Muscle weakness (generalized): Secondary | ICD-10-CM | POA: Diagnosis not present

## 2020-09-13 DIAGNOSIS — R2681 Unsteadiness on feet: Secondary | ICD-10-CM

## 2020-09-13 NOTE — Patient Instructions (Addendum)
Access Code: [email or web address] URL: [email or web address] Date: 09/13/2020 Prepared by: Rudell Cobb  Exercises Side Stepping with Counter Support - 2 x daily - 7 x weekly - 1 sets - 5 reps Sit to Stand with Counter Support - 2 x daily - 7 x weekly - 1 sets - 5 reps

## 2020-09-13 NOTE — Therapy (Signed)
[place], [place], [address] Phone: [phone]   Fax:  [phone]  Physical Therapy Evaluation  Patient Details  Name: Shelby Gay MRN: [id number] Date of Birth: 12/14/1930 Referring Provider (PT): Antony Contras, MD   Encounter Date: 09/13/2020   PT End of Session - 09/13/20 [id number]     Visit Number 1    Number of Visits 12    Date for PT Re-Evaluation 10/25/20    Authorization Type humana    Authorization - Number of Visits 12    Progress Note Due on Visit 10    PT Start Time 1102    PT Stop Time 1153    PT Time Calculation (min) 51 min             Past Medical History:  Diagnosis Date   Atrial fibrillation ([place])    Diabetes mellitus without complication ([facility])    Hypertension    Thyroid disease     No past surgical history on file.  There were no vitals filed for this visit.    Subjective Assessment - 09/13/20 1105     Subjective The patient reports overall worsening weakness.  "I want to get some help on knowing how to balance."  She reports 3 falls in the past 6 months.  She had stitches on her forehead with bruising.  Her falls occurred while she was in the yard and she bent forward to pick up trash and fell onto stepping stones.  She is now using the cane indoors and outdoors for walking.  When she falls, she is unable to get up by herself.    Pertinent History HTN, diabetes, thyroid dysfunction, a-fib, pacemaker, h/o heart valve replacement    Patient Stated Goals Help for balance.    Currently in Pain? No/denies    Aggravating Factors  does have a h/o some head pain, but not from recent fall                [facility] PT Assessment - 09/13/20 1109       Assessment   Medical Diagnosis gait instability    Referring Provider (PT) Antony Contras, MD    Onset Date/Surgical Date 09/03/20    Hand Dominance Right      Precautions   Precautions Fall      Restrictions    Weight Bearing Restrictions No      Balance Screen   Has the patient fallen in the past 6 months Yes    How many times? 3    Has the patient had a decrease in activity level because of a fear of falling?  Yes    Is the patient reluctant to leave their home because of a fear of falling?  Yes      [place] Private residence    Living Arrangements Alone    Type of [place] entrance    [place] to live on main level with bedroom/bathroom    [place] - single point;[place] - 4 wheels    Additional Comments only using the rollator for moving dishes to and from the table      Prior Function   Level of Independence Independent    Leisure noted "out of breath" feeling for years, but weakness is worsening      Cognition   Overall Cognitive Status --   good with remembering day to day  [place], [place], [address] Phone: [phone]   Fax:  [phone]  Physical Therapy Evaluation  Patient Details  Name: Shelby Gay MRN: [id number] Date of Birth: 12/14/1930 Referring Provider (PT): Antony Contras, MD   Encounter Date: 09/13/2020   PT End of Session - 09/13/20 [id number]     Visit Number 1    Number of Visits 12    Date for PT Re-Evaluation 10/25/20    Authorization Type humana    Authorization - Number of Visits 12    Progress Note Due on Visit 10    PT Start Time 1102    PT Stop Time 1153    PT Time Calculation (min) 51 min             Past Medical History:  Diagnosis Date   Atrial fibrillation ([place])    Diabetes mellitus without complication ([facility])    Hypertension    Thyroid disease     No past surgical history on file.  There were no vitals filed for this visit.    Subjective Assessment - 09/13/20 1105     Subjective The patient reports overall worsening weakness.  "I want to get some help on knowing how to balance."  She reports 3 falls in the past 6 months.  She had stitches on her forehead with bruising.  Her falls occurred while she was in the yard and she bent forward to pick up trash and fell onto stepping stones.  She is now using the cane indoors and outdoors for walking.  When she falls, she is unable to get up by herself.    Pertinent History HTN, diabetes, thyroid dysfunction, a-fib, pacemaker, h/o heart valve replacement    Patient Stated Goals Help for balance.    Currently in Pain? No/denies    Aggravating Factors  does have a h/o some head pain, but not from recent fall                [facility] PT Assessment - 09/13/20 1109       Assessment   Medical Diagnosis gait instability    Referring Provider (PT) Antony Contras, MD    Onset Date/Surgical Date 09/03/20    Hand Dominance Right      Precautions   Precautions Fall      Restrictions    Weight Bearing Restrictions No      Balance Screen   Has the patient fallen in the past 6 months Yes    How many times? 3    Has the patient had a decrease in activity level because of a fear of falling?  Yes    Is the patient reluctant to leave their home because of a fear of falling?  Yes      [place] Private residence    Living Arrangements Alone    Type of [place] entrance    [place] to live on main level with bedroom/bathroom    [place] - single point;[place] - 4 wheels    Additional Comments only using the rollator for moving dishes to and from the table      Prior Function   Level of Independence Independent    Leisure noted "out of breath" feeling for years, but weakness is worsening      Cognition   Overall Cognitive Status --   good with remembering day to day  from skilled therapeutic intervention in order to improve the following deficits and impairments:  Decreased activity tolerance, Decreased balance, Difficulty walking, Decreased strength, Decreased endurance, Decreased mobility  Visit Diagnosis: Other abnormalities of gait and mobility  Muscle weakness (generalized)  Unsteadiness on feet     Problem List Patient Active Problem List   Diagnosis Date Noted   Palpitations 09/08/2016   Rudell Cobb, MPT  Mekhia Brogan 09/13/2020, 6:12 PM  [facility] [place], [place], [address] Phone: [phone]   Fax:  [phone]  Name: ALESI KROFT MRN: [id number] Date of Birth: Feb 25, 1930  [place], [place], [address] Phone: [phone]   Fax:  [phone]  Physical Therapy Evaluation  Patient Details  Name: Shelby Gay MRN: [id number] Date of Birth: 12/14/1930 Referring Provider (PT): Antony Contras, MD   Encounter Date: 09/13/2020   PT End of Session - 09/13/20 [id number]     Visit Number 1    Number of Visits 12    Date for PT Re-Evaluation 10/25/20    Authorization Type humana    Authorization - Number of Visits 12    Progress Note Due on Visit 10    PT Start Time 1102    PT Stop Time 1153    PT Time Calculation (min) 51 min             Past Medical History:  Diagnosis Date   Atrial fibrillation ([place])    Diabetes mellitus without complication ([facility])    Hypertension    Thyroid disease     No past surgical history on file.  There were no vitals filed for this visit.    Subjective Assessment - 09/13/20 1105     Subjective The patient reports overall worsening weakness.  "I want to get some help on knowing how to balance."  She reports 3 falls in the past 6 months.  She had stitches on her forehead with bruising.  Her falls occurred while she was in the yard and she bent forward to pick up trash and fell onto stepping stones.  She is now using the cane indoors and outdoors for walking.  When she falls, she is unable to get up by herself.    Pertinent History HTN, diabetes, thyroid dysfunction, a-fib, pacemaker, h/o heart valve replacement    Patient Stated Goals Help for balance.    Currently in Pain? No/denies    Aggravating Factors  does have a h/o some head pain, but not from recent fall                [facility] PT Assessment - 09/13/20 1109       Assessment   Medical Diagnosis gait instability    Referring Provider (PT) Antony Contras, MD    Onset Date/Surgical Date 09/03/20    Hand Dominance Right      Precautions   Precautions Fall      Restrictions    Weight Bearing Restrictions No      Balance Screen   Has the patient fallen in the past 6 months Yes    How many times? 3    Has the patient had a decrease in activity level because of a fear of falling?  Yes    Is the patient reluctant to leave their home because of a fear of falling?  Yes      [place] Private residence    Living Arrangements Alone    Type of [place] entrance    [place] to live on main level with bedroom/bathroom    [place] - single point;[place] - 4 wheels    Additional Comments only using the rollator for moving dishes to and from the table      Prior Function   Level of Independence Independent    Leisure noted "out of breath" feeling for years, but weakness is worsening      Cognition   Overall Cognitive Status --   good with remembering day to day

## 2020-09-17 ENCOUNTER — Encounter: Payer: Medicare HMO | Admitting: Rehabilitative and Restorative Service Providers"

## 2020-09-19 ENCOUNTER — Encounter: Payer: Medicare HMO | Admitting: Rehabilitative and Restorative Service Providers"

## 2020-09-23 DIAGNOSIS — D500 Iron deficiency anemia secondary to blood loss (chronic): Secondary | ICD-10-CM | POA: Diagnosis not present

## 2020-09-23 DIAGNOSIS — I10 Essential (primary) hypertension: Secondary | ICD-10-CM | POA: Diagnosis not present

## 2020-09-23 DIAGNOSIS — I4891 Unspecified atrial fibrillation: Secondary | ICD-10-CM | POA: Diagnosis not present

## 2020-09-23 DIAGNOSIS — E039 Hypothyroidism, unspecified: Secondary | ICD-10-CM | POA: Diagnosis not present

## 2020-09-23 DIAGNOSIS — I129 Hypertensive chronic kidney disease with stage 1 through stage 4 chronic kidney disease, or unspecified chronic kidney disease: Secondary | ICD-10-CM | POA: Diagnosis not present

## 2020-09-23 DIAGNOSIS — N1832 Chronic kidney disease, stage 3b: Secondary | ICD-10-CM | POA: Diagnosis not present

## 2020-09-23 DIAGNOSIS — K449 Diaphragmatic hernia without obstruction or gangrene: Secondary | ICD-10-CM | POA: Diagnosis not present

## 2020-09-23 DIAGNOSIS — I4510 Unspecified right bundle-branch block: Secondary | ICD-10-CM | POA: Diagnosis not present

## 2020-09-23 DIAGNOSIS — R079 Chest pain, unspecified: Secondary | ICD-10-CM | POA: Diagnosis not present

## 2020-09-23 DIAGNOSIS — R0989 Other specified symptoms and signs involving the circulatory and respiratory systems: Secondary | ICD-10-CM | POA: Diagnosis not present

## 2020-09-23 DIAGNOSIS — N179 Acute kidney failure, unspecified: Secondary | ICD-10-CM | POA: Diagnosis not present

## 2020-09-23 DIAGNOSIS — R300 Dysuria: Secondary | ICD-10-CM | POA: Diagnosis not present

## 2020-09-23 DIAGNOSIS — K2950 Unspecified chronic gastritis without bleeding: Secondary | ICD-10-CM | POA: Diagnosis not present

## 2020-09-23 DIAGNOSIS — K921 Melena: Secondary | ICD-10-CM | POA: Diagnosis not present

## 2020-09-23 DIAGNOSIS — J90 Pleural effusion, not elsewhere classified: Secondary | ICD-10-CM | POA: Diagnosis not present

## 2020-09-23 DIAGNOSIS — K31A11 Gastric intestinal metaplasia without dysplasia, involving the antrum: Secondary | ICD-10-CM | POA: Diagnosis not present

## 2020-09-23 DIAGNOSIS — R42 Dizziness and giddiness: Secondary | ICD-10-CM | POA: Diagnosis not present

## 2020-09-23 DIAGNOSIS — K922 Gastrointestinal hemorrhage, unspecified: Secondary | ICD-10-CM | POA: Diagnosis not present

## 2020-09-23 DIAGNOSIS — K259 Gastric ulcer, unspecified as acute or chronic, without hemorrhage or perforation: Secondary | ICD-10-CM | POA: Diagnosis not present

## 2020-09-24 ENCOUNTER — Encounter: Payer: Medicare HMO | Admitting: Physical Therapy

## 2020-09-24 DIAGNOSIS — E039 Hypothyroidism, unspecified: Secondary | ICD-10-CM | POA: Diagnosis not present

## 2020-09-24 DIAGNOSIS — D62 Acute posthemorrhagic anemia: Secondary | ICD-10-CM | POA: Diagnosis not present

## 2020-09-24 DIAGNOSIS — N1832 Chronic kidney disease, stage 3b: Secondary | ICD-10-CM | POA: Diagnosis not present

## 2020-09-24 DIAGNOSIS — Z954 Presence of other heart-valve replacement: Secondary | ICD-10-CM | POA: Diagnosis not present

## 2020-09-24 DIAGNOSIS — D649 Anemia, unspecified: Secondary | ICD-10-CM | POA: Diagnosis not present

## 2020-09-24 DIAGNOSIS — I4891 Unspecified atrial fibrillation: Secondary | ICD-10-CM | POA: Diagnosis not present

## 2020-09-24 DIAGNOSIS — Z7982 Long term (current) use of aspirin: Secondary | ICD-10-CM | POA: Diagnosis not present

## 2020-09-24 DIAGNOSIS — R110 Nausea: Secondary | ICD-10-CM | POA: Diagnosis not present

## 2020-09-24 DIAGNOSIS — I10 Essential (primary) hypertension: Secondary | ICD-10-CM | POA: Diagnosis not present

## 2020-09-24 DIAGNOSIS — Z7901 Long term (current) use of anticoagulants: Secondary | ICD-10-CM | POA: Diagnosis not present

## 2020-09-24 DIAGNOSIS — K922 Gastrointestinal hemorrhage, unspecified: Secondary | ICD-10-CM | POA: Diagnosis not present

## 2020-09-25 DIAGNOSIS — E039 Hypothyroidism, unspecified: Secondary | ICD-10-CM | POA: Diagnosis not present

## 2020-09-25 DIAGNOSIS — D649 Anemia, unspecified: Secondary | ICD-10-CM | POA: Diagnosis not present

## 2020-09-25 DIAGNOSIS — I252 Old myocardial infarction: Secondary | ICD-10-CM | POA: Diagnosis not present

## 2020-09-25 DIAGNOSIS — N1831 Chronic kidney disease, stage 3a: Secondary | ICD-10-CM | POA: Diagnosis not present

## 2020-09-25 DIAGNOSIS — I10 Essential (primary) hypertension: Secondary | ICD-10-CM | POA: Diagnosis not present

## 2020-09-25 DIAGNOSIS — K259 Gastric ulcer, unspecified as acute or chronic, without hemorrhage or perforation: Secondary | ICD-10-CM | POA: Diagnosis not present

## 2020-09-25 DIAGNOSIS — K449 Diaphragmatic hernia without obstruction or gangrene: Secondary | ICD-10-CM | POA: Diagnosis not present

## 2020-09-25 DIAGNOSIS — Z950 Presence of cardiac pacemaker: Secondary | ICD-10-CM | POA: Diagnosis not present

## 2020-09-25 DIAGNOSIS — I4891 Unspecified atrial fibrillation: Secondary | ICD-10-CM | POA: Diagnosis not present

## 2020-09-25 DIAGNOSIS — K31A11 Gastric intestinal metaplasia without dysplasia, involving the antrum: Secondary | ICD-10-CM | POA: Diagnosis not present

## 2020-09-25 DIAGNOSIS — N1832 Chronic kidney disease, stage 3b: Secondary | ICD-10-CM | POA: Diagnosis not present

## 2020-09-25 DIAGNOSIS — I129 Hypertensive chronic kidney disease with stage 1 through stage 4 chronic kidney disease, or unspecified chronic kidney disease: Secondary | ICD-10-CM | POA: Diagnosis not present

## 2020-09-25 DIAGNOSIS — K31A0 Gastric intestinal metaplasia, unspecified: Secondary | ICD-10-CM | POA: Diagnosis not present

## 2020-09-25 DIAGNOSIS — K2950 Unspecified chronic gastritis without bleeding: Secondary | ICD-10-CM | POA: Diagnosis not present

## 2020-09-25 DIAGNOSIS — K922 Gastrointestinal hemorrhage, unspecified: Secondary | ICD-10-CM | POA: Diagnosis not present

## 2020-09-26 ENCOUNTER — Encounter: Payer: Medicare HMO | Admitting: Rehabilitative and Restorative Service Providers"

## 2020-09-26 DIAGNOSIS — K31A0 Gastric intestinal metaplasia, unspecified: Secondary | ICD-10-CM | POA: Diagnosis not present

## 2020-09-26 DIAGNOSIS — N1832 Chronic kidney disease, stage 3b: Secondary | ICD-10-CM | POA: Diagnosis not present

## 2020-09-26 DIAGNOSIS — I10 Essential (primary) hypertension: Secondary | ICD-10-CM | POA: Diagnosis not present

## 2020-09-26 DIAGNOSIS — K2950 Unspecified chronic gastritis without bleeding: Secondary | ICD-10-CM | POA: Diagnosis not present

## 2020-09-26 DIAGNOSIS — D62 Acute posthemorrhagic anemia: Secondary | ICD-10-CM | POA: Diagnosis not present

## 2020-09-26 DIAGNOSIS — K250 Acute gastric ulcer with hemorrhage: Secondary | ICD-10-CM | POA: Diagnosis not present

## 2020-09-26 DIAGNOSIS — I4891 Unspecified atrial fibrillation: Secondary | ICD-10-CM | POA: Diagnosis not present

## 2020-09-26 DIAGNOSIS — K254 Chronic or unspecified gastric ulcer with hemorrhage: Secondary | ICD-10-CM | POA: Diagnosis not present

## 2020-09-26 DIAGNOSIS — E039 Hypothyroidism, unspecified: Secondary | ICD-10-CM | POA: Diagnosis not present

## 2020-09-29 DIAGNOSIS — E119 Type 2 diabetes mellitus without complications: Secondary | ICD-10-CM | POA: Diagnosis not present

## 2020-09-29 DIAGNOSIS — K253 Acute gastric ulcer without hemorrhage or perforation: Secondary | ICD-10-CM | POA: Diagnosis not present

## 2020-09-29 DIAGNOSIS — N1832 Chronic kidney disease, stage 3b: Secondary | ICD-10-CM | POA: Diagnosis not present

## 2020-09-29 DIAGNOSIS — I4891 Unspecified atrial fibrillation: Secondary | ICD-10-CM | POA: Diagnosis not present

## 2020-09-29 DIAGNOSIS — N390 Urinary tract infection, site not specified: Secondary | ICD-10-CM | POA: Diagnosis not present

## 2020-09-29 DIAGNOSIS — M1990 Unspecified osteoarthritis, unspecified site: Secondary | ICD-10-CM | POA: Diagnosis not present

## 2020-09-29 DIAGNOSIS — D631 Anemia in chronic kidney disease: Secondary | ICD-10-CM | POA: Diagnosis not present

## 2020-09-29 DIAGNOSIS — E039 Hypothyroidism, unspecified: Secondary | ICD-10-CM | POA: Diagnosis not present

## 2020-09-29 DIAGNOSIS — I129 Hypertensive chronic kidney disease with stage 1 through stage 4 chronic kidney disease, or unspecified chronic kidney disease: Secondary | ICD-10-CM | POA: Diagnosis not present

## 2020-10-01 ENCOUNTER — Encounter: Payer: Medicare HMO | Admitting: Rehabilitative and Restorative Service Providers"

## 2020-10-01 DIAGNOSIS — I129 Hypertensive chronic kidney disease with stage 1 through stage 4 chronic kidney disease, or unspecified chronic kidney disease: Secondary | ICD-10-CM | POA: Diagnosis not present

## 2020-10-01 DIAGNOSIS — K253 Acute gastric ulcer without hemorrhage or perforation: Secondary | ICD-10-CM | POA: Diagnosis not present

## 2020-10-01 DIAGNOSIS — N390 Urinary tract infection, site not specified: Secondary | ICD-10-CM | POA: Diagnosis not present

## 2020-10-01 DIAGNOSIS — E119 Type 2 diabetes mellitus without complications: Secondary | ICD-10-CM | POA: Diagnosis not present

## 2020-10-01 DIAGNOSIS — D631 Anemia in chronic kidney disease: Secondary | ICD-10-CM | POA: Diagnosis not present

## 2020-10-01 DIAGNOSIS — E039 Hypothyroidism, unspecified: Secondary | ICD-10-CM | POA: Diagnosis not present

## 2020-10-01 DIAGNOSIS — M1990 Unspecified osteoarthritis, unspecified site: Secondary | ICD-10-CM | POA: Diagnosis not present

## 2020-10-01 DIAGNOSIS — N1832 Chronic kidney disease, stage 3b: Secondary | ICD-10-CM | POA: Diagnosis not present

## 2020-10-01 DIAGNOSIS — I4891 Unspecified atrial fibrillation: Secondary | ICD-10-CM | POA: Diagnosis not present

## 2020-10-03 ENCOUNTER — Encounter: Payer: Medicare HMO | Admitting: Physical Therapy

## 2020-10-03 DIAGNOSIS — Z9289 Personal history of other medical treatment: Secondary | ICD-10-CM | POA: Diagnosis not present

## 2020-10-03 DIAGNOSIS — D62 Acute posthemorrhagic anemia: Secondary | ICD-10-CM | POA: Diagnosis not present

## 2020-10-03 DIAGNOSIS — M7989 Other specified soft tissue disorders: Secondary | ICD-10-CM | POA: Diagnosis not present

## 2020-10-03 DIAGNOSIS — K250 Acute gastric ulcer with hemorrhage: Secondary | ICD-10-CM | POA: Diagnosis not present

## 2020-10-04 DIAGNOSIS — R519 Headache, unspecified: Secondary | ICD-10-CM | POA: Diagnosis not present

## 2020-10-04 DIAGNOSIS — G8929 Other chronic pain: Secondary | ICD-10-CM | POA: Diagnosis not present

## 2020-10-14 DIAGNOSIS — N1832 Chronic kidney disease, stage 3b: Secondary | ICD-10-CM | POA: Diagnosis not present

## 2020-10-14 DIAGNOSIS — M7989 Other specified soft tissue disorders: Secondary | ICD-10-CM | POA: Diagnosis not present

## 2020-10-14 DIAGNOSIS — I10 Essential (primary) hypertension: Secondary | ICD-10-CM | POA: Diagnosis not present

## 2020-10-14 DIAGNOSIS — Z952 Presence of prosthetic heart valve: Secondary | ICD-10-CM | POA: Diagnosis not present

## 2020-10-14 DIAGNOSIS — I25118 Atherosclerotic heart disease of native coronary artery with other forms of angina pectoris: Secondary | ICD-10-CM | POA: Diagnosis not present

## 2020-10-14 DIAGNOSIS — M1990 Unspecified osteoarthritis, unspecified site: Secondary | ICD-10-CM | POA: Diagnosis not present

## 2020-10-14 DIAGNOSIS — N390 Urinary tract infection, site not specified: Secondary | ICD-10-CM | POA: Diagnosis not present

## 2020-10-14 DIAGNOSIS — E119 Type 2 diabetes mellitus without complications: Secondary | ICD-10-CM | POA: Diagnosis not present

## 2020-10-14 DIAGNOSIS — I129 Hypertensive chronic kidney disease with stage 1 through stage 4 chronic kidney disease, or unspecified chronic kidney disease: Secondary | ICD-10-CM | POA: Diagnosis not present

## 2020-10-14 DIAGNOSIS — K253 Acute gastric ulcer without hemorrhage or perforation: Secondary | ICD-10-CM | POA: Diagnosis not present

## 2020-10-14 DIAGNOSIS — E039 Hypothyroidism, unspecified: Secondary | ICD-10-CM | POA: Diagnosis not present

## 2020-10-14 DIAGNOSIS — Z45018 Encounter for adjustment and management of other part of cardiac pacemaker: Secondary | ICD-10-CM | POA: Diagnosis not present

## 2020-10-14 DIAGNOSIS — R0602 Shortness of breath: Secondary | ICD-10-CM | POA: Diagnosis not present

## 2020-10-14 DIAGNOSIS — T85848D Pain due to other internal prosthetic devices, implants and grafts, subsequent encounter: Secondary | ICD-10-CM | POA: Diagnosis not present

## 2020-10-14 DIAGNOSIS — I480 Paroxysmal atrial fibrillation: Secondary | ICD-10-CM | POA: Diagnosis not present

## 2020-10-14 DIAGNOSIS — I4891 Unspecified atrial fibrillation: Secondary | ICD-10-CM | POA: Diagnosis not present

## 2020-10-14 DIAGNOSIS — D631 Anemia in chronic kidney disease: Secondary | ICD-10-CM | POA: Diagnosis not present

## 2020-10-15 DIAGNOSIS — N390 Urinary tract infection, site not specified: Secondary | ICD-10-CM | POA: Diagnosis not present

## 2020-10-15 DIAGNOSIS — I4891 Unspecified atrial fibrillation: Secondary | ICD-10-CM | POA: Diagnosis not present

## 2020-10-15 DIAGNOSIS — K253 Acute gastric ulcer without hemorrhage or perforation: Secondary | ICD-10-CM | POA: Diagnosis not present

## 2020-10-15 DIAGNOSIS — I129 Hypertensive chronic kidney disease with stage 1 through stage 4 chronic kidney disease, or unspecified chronic kidney disease: Secondary | ICD-10-CM | POA: Diagnosis not present

## 2020-10-15 DIAGNOSIS — E039 Hypothyroidism, unspecified: Secondary | ICD-10-CM | POA: Diagnosis not present

## 2020-10-15 DIAGNOSIS — D631 Anemia in chronic kidney disease: Secondary | ICD-10-CM | POA: Diagnosis not present

## 2020-10-15 DIAGNOSIS — N1832 Chronic kidney disease, stage 3b: Secondary | ICD-10-CM | POA: Diagnosis not present

## 2020-10-15 DIAGNOSIS — E119 Type 2 diabetes mellitus without complications: Secondary | ICD-10-CM | POA: Diagnosis not present

## 2020-10-15 DIAGNOSIS — M1990 Unspecified osteoarthritis, unspecified site: Secondary | ICD-10-CM | POA: Diagnosis not present

## 2020-10-16 DIAGNOSIS — I2699 Other pulmonary embolism without acute cor pulmonale: Secondary | ICD-10-CM | POA: Diagnosis not present

## 2020-10-16 DIAGNOSIS — I10 Essential (primary) hypertension: Secondary | ICD-10-CM | POA: Diagnosis not present

## 2020-10-16 DIAGNOSIS — J9601 Acute respiratory failure with hypoxia: Secondary | ICD-10-CM | POA: Diagnosis not present

## 2020-10-16 DIAGNOSIS — K253 Acute gastric ulcer without hemorrhage or perforation: Secondary | ICD-10-CM | POA: Diagnosis not present

## 2020-10-16 DIAGNOSIS — D631 Anemia in chronic kidney disease: Secondary | ICD-10-CM | POA: Diagnosis not present

## 2020-10-16 DIAGNOSIS — N1832 Chronic kidney disease, stage 3b: Secondary | ICD-10-CM | POA: Diagnosis not present

## 2020-10-16 DIAGNOSIS — I509 Heart failure, unspecified: Secondary | ICD-10-CM | POA: Diagnosis not present

## 2020-10-16 DIAGNOSIS — M1990 Unspecified osteoarthritis, unspecified site: Secondary | ICD-10-CM | POA: Diagnosis not present

## 2020-10-16 DIAGNOSIS — I517 Cardiomegaly: Secondary | ICD-10-CM | POA: Diagnosis not present

## 2020-10-16 DIAGNOSIS — I129 Hypertensive chronic kidney disease with stage 1 through stage 4 chronic kidney disease, or unspecified chronic kidney disease: Secondary | ICD-10-CM | POA: Diagnosis not present

## 2020-10-16 DIAGNOSIS — J984 Other disorders of lung: Secondary | ICD-10-CM | POA: Diagnosis not present

## 2020-10-16 DIAGNOSIS — E119 Type 2 diabetes mellitus without complications: Secondary | ICD-10-CM | POA: Diagnosis not present

## 2020-10-16 DIAGNOSIS — Z20822 Contact with and (suspected) exposure to covid-19: Secondary | ICD-10-CM | POA: Diagnosis not present

## 2020-10-16 DIAGNOSIS — I130 Hypertensive heart and chronic kidney disease with heart failure and stage 1 through stage 4 chronic kidney disease, or unspecified chronic kidney disease: Secondary | ICD-10-CM | POA: Diagnosis not present

## 2020-10-16 DIAGNOSIS — I50811 Acute right heart failure: Secondary | ICD-10-CM | POA: Diagnosis not present

## 2020-10-16 DIAGNOSIS — N179 Acute kidney failure, unspecified: Secondary | ICD-10-CM | POA: Diagnosis not present

## 2020-10-16 DIAGNOSIS — R0602 Shortness of breath: Secondary | ICD-10-CM | POA: Diagnosis not present

## 2020-10-16 DIAGNOSIS — Z950 Presence of cardiac pacemaker: Secondary | ICD-10-CM | POA: Diagnosis not present

## 2020-10-16 DIAGNOSIS — I480 Paroxysmal atrial fibrillation: Secondary | ICD-10-CM | POA: Diagnosis not present

## 2020-10-16 DIAGNOSIS — R0902 Hypoxemia: Secondary | ICD-10-CM | POA: Diagnosis not present

## 2020-10-16 DIAGNOSIS — D649 Anemia, unspecified: Secondary | ICD-10-CM | POA: Diagnosis not present

## 2020-10-16 DIAGNOSIS — J90 Pleural effusion, not elsewhere classified: Secondary | ICD-10-CM | POA: Diagnosis not present

## 2020-10-16 DIAGNOSIS — E039 Hypothyroidism, unspecified: Secondary | ICD-10-CM | POA: Diagnosis not present

## 2020-10-16 DIAGNOSIS — I4891 Unspecified atrial fibrillation: Secondary | ICD-10-CM | POA: Diagnosis not present

## 2020-10-16 DIAGNOSIS — I5031 Acute diastolic (congestive) heart failure: Secondary | ICD-10-CM | POA: Diagnosis not present

## 2020-10-16 DIAGNOSIS — J811 Chronic pulmonary edema: Secondary | ICD-10-CM | POA: Diagnosis not present

## 2020-10-16 DIAGNOSIS — G4700 Insomnia, unspecified: Secondary | ICD-10-CM | POA: Diagnosis not present

## 2020-10-16 DIAGNOSIS — N390 Urinary tract infection, site not specified: Secondary | ICD-10-CM | POA: Diagnosis not present

## 2020-10-16 DIAGNOSIS — R918 Other nonspecific abnormal finding of lung field: Secondary | ICD-10-CM | POA: Diagnosis not present

## 2020-10-16 DIAGNOSIS — Z953 Presence of xenogenic heart valve: Secondary | ICD-10-CM | POA: Diagnosis not present

## 2020-10-16 DIAGNOSIS — E7800 Pure hypercholesterolemia, unspecified: Secondary | ICD-10-CM | POA: Diagnosis not present

## 2020-10-16 DIAGNOSIS — N184 Chronic kidney disease, stage 4 (severe): Secondary | ICD-10-CM | POA: Diagnosis not present

## 2020-10-16 DIAGNOSIS — Z952 Presence of prosthetic heart valve: Secondary | ICD-10-CM | POA: Diagnosis not present

## 2020-10-16 DIAGNOSIS — I081 Rheumatic disorders of both mitral and tricuspid valves: Secondary | ICD-10-CM | POA: Diagnosis not present

## 2020-10-16 DIAGNOSIS — I82441 Acute embolism and thrombosis of right tibial vein: Secondary | ICD-10-CM | POA: Diagnosis not present

## 2020-10-16 DIAGNOSIS — M8580 Other specified disorders of bone density and structure, unspecified site: Secondary | ICD-10-CM | POA: Diagnosis not present

## 2020-10-16 DIAGNOSIS — E1122 Type 2 diabetes mellitus with diabetic chronic kidney disease: Secondary | ICD-10-CM | POA: Diagnosis not present

## 2020-10-16 DIAGNOSIS — Z8719 Personal history of other diseases of the digestive system: Secondary | ICD-10-CM | POA: Diagnosis not present

## 2020-10-16 DIAGNOSIS — I2510 Atherosclerotic heart disease of native coronary artery without angina pectoris: Secondary | ICD-10-CM | POA: Diagnosis not present

## 2020-10-16 DIAGNOSIS — J189 Pneumonia, unspecified organism: Secondary | ICD-10-CM | POA: Diagnosis not present

## 2020-10-17 DIAGNOSIS — I517 Cardiomegaly: Secondary | ICD-10-CM | POA: Diagnosis not present

## 2020-10-17 DIAGNOSIS — J189 Pneumonia, unspecified organism: Secondary | ICD-10-CM | POA: Diagnosis not present

## 2020-10-17 DIAGNOSIS — I081 Rheumatic disorders of both mitral and tricuspid valves: Secondary | ICD-10-CM | POA: Diagnosis not present

## 2020-10-17 DIAGNOSIS — I50811 Acute right heart failure: Secondary | ICD-10-CM | POA: Diagnosis not present

## 2020-10-17 DIAGNOSIS — I4891 Unspecified atrial fibrillation: Secondary | ICD-10-CM | POA: Diagnosis not present

## 2020-10-17 DIAGNOSIS — I10 Essential (primary) hypertension: Secondary | ICD-10-CM | POA: Diagnosis not present

## 2020-10-17 DIAGNOSIS — J9601 Acute respiratory failure with hypoxia: Secondary | ICD-10-CM | POA: Diagnosis not present

## 2020-10-17 DIAGNOSIS — Z8719 Personal history of other diseases of the digestive system: Secondary | ICD-10-CM | POA: Diagnosis not present

## 2020-10-17 DIAGNOSIS — Z952 Presence of prosthetic heart valve: Secondary | ICD-10-CM | POA: Diagnosis not present

## 2020-10-17 DIAGNOSIS — Z953 Presence of xenogenic heart valve: Secondary | ICD-10-CM | POA: Diagnosis not present

## 2020-10-17 DIAGNOSIS — E039 Hypothyroidism, unspecified: Secondary | ICD-10-CM | POA: Diagnosis not present

## 2020-10-17 DIAGNOSIS — D649 Anemia, unspecified: Secondary | ICD-10-CM | POA: Diagnosis not present

## 2020-10-18 DIAGNOSIS — I4891 Unspecified atrial fibrillation: Secondary | ICD-10-CM | POA: Diagnosis not present

## 2020-10-18 DIAGNOSIS — Z8719 Personal history of other diseases of the digestive system: Secondary | ICD-10-CM | POA: Diagnosis not present

## 2020-10-18 DIAGNOSIS — J9601 Acute respiratory failure with hypoxia: Secondary | ICD-10-CM | POA: Diagnosis not present

## 2020-10-18 DIAGNOSIS — I10 Essential (primary) hypertension: Secondary | ICD-10-CM | POA: Diagnosis not present

## 2020-10-18 DIAGNOSIS — J189 Pneumonia, unspecified organism: Secondary | ICD-10-CM | POA: Diagnosis not present

## 2020-10-18 DIAGNOSIS — Z952 Presence of prosthetic heart valve: Secondary | ICD-10-CM | POA: Diagnosis not present

## 2020-10-18 DIAGNOSIS — I50811 Acute right heart failure: Secondary | ICD-10-CM | POA: Diagnosis not present

## 2020-10-18 DIAGNOSIS — E039 Hypothyroidism, unspecified: Secondary | ICD-10-CM | POA: Diagnosis not present

## 2020-10-18 DIAGNOSIS — D649 Anemia, unspecified: Secondary | ICD-10-CM | POA: Diagnosis not present

## 2020-10-19 DIAGNOSIS — J9601 Acute respiratory failure with hypoxia: Secondary | ICD-10-CM | POA: Diagnosis not present

## 2020-10-19 DIAGNOSIS — E039 Hypothyroidism, unspecified: Secondary | ICD-10-CM | POA: Diagnosis not present

## 2020-10-19 DIAGNOSIS — I4891 Unspecified atrial fibrillation: Secondary | ICD-10-CM | POA: Diagnosis not present

## 2020-10-19 DIAGNOSIS — Z8719 Personal history of other diseases of the digestive system: Secondary | ICD-10-CM | POA: Diagnosis not present

## 2020-10-19 DIAGNOSIS — I50811 Acute right heart failure: Secondary | ICD-10-CM | POA: Diagnosis not present

## 2020-10-19 DIAGNOSIS — I10 Essential (primary) hypertension: Secondary | ICD-10-CM | POA: Diagnosis not present

## 2020-10-19 DIAGNOSIS — D649 Anemia, unspecified: Secondary | ICD-10-CM | POA: Diagnosis not present

## 2020-10-19 DIAGNOSIS — Z952 Presence of prosthetic heart valve: Secondary | ICD-10-CM | POA: Diagnosis not present

## 2020-10-19 DIAGNOSIS — J189 Pneumonia, unspecified organism: Secondary | ICD-10-CM | POA: Diagnosis not present

## 2020-10-20 DIAGNOSIS — Z8719 Personal history of other diseases of the digestive system: Secondary | ICD-10-CM | POA: Diagnosis not present

## 2020-10-20 DIAGNOSIS — I4891 Unspecified atrial fibrillation: Secondary | ICD-10-CM | POA: Diagnosis not present

## 2020-10-20 DIAGNOSIS — J9601 Acute respiratory failure with hypoxia: Secondary | ICD-10-CM | POA: Diagnosis not present

## 2020-10-20 DIAGNOSIS — I50811 Acute right heart failure: Secondary | ICD-10-CM | POA: Diagnosis not present

## 2020-10-20 DIAGNOSIS — I10 Essential (primary) hypertension: Secondary | ICD-10-CM | POA: Diagnosis not present

## 2020-10-20 DIAGNOSIS — J189 Pneumonia, unspecified organism: Secondary | ICD-10-CM | POA: Diagnosis not present

## 2020-10-20 DIAGNOSIS — D649 Anemia, unspecified: Secondary | ICD-10-CM | POA: Diagnosis not present

## 2020-10-20 DIAGNOSIS — E039 Hypothyroidism, unspecified: Secondary | ICD-10-CM | POA: Diagnosis not present

## 2020-10-20 DIAGNOSIS — Z952 Presence of prosthetic heart valve: Secondary | ICD-10-CM | POA: Diagnosis not present

## 2020-10-21 DIAGNOSIS — J189 Pneumonia, unspecified organism: Secondary | ICD-10-CM | POA: Diagnosis not present

## 2020-10-21 DIAGNOSIS — J9601 Acute respiratory failure with hypoxia: Secondary | ICD-10-CM | POA: Diagnosis not present

## 2020-10-21 DIAGNOSIS — E039 Hypothyroidism, unspecified: Secondary | ICD-10-CM | POA: Diagnosis not present

## 2020-10-21 DIAGNOSIS — I50811 Acute right heart failure: Secondary | ICD-10-CM | POA: Diagnosis not present

## 2020-10-21 DIAGNOSIS — Z8719 Personal history of other diseases of the digestive system: Secondary | ICD-10-CM | POA: Diagnosis not present

## 2020-10-21 DIAGNOSIS — I10 Essential (primary) hypertension: Secondary | ICD-10-CM | POA: Diagnosis not present

## 2020-10-21 DIAGNOSIS — Z952 Presence of prosthetic heart valve: Secondary | ICD-10-CM | POA: Diagnosis not present

## 2020-10-21 DIAGNOSIS — I4891 Unspecified atrial fibrillation: Secondary | ICD-10-CM | POA: Diagnosis not present

## 2020-10-21 DIAGNOSIS — D649 Anemia, unspecified: Secondary | ICD-10-CM | POA: Diagnosis not present

## 2020-10-22 DIAGNOSIS — N1832 Chronic kidney disease, stage 3b: Secondary | ICD-10-CM | POA: Diagnosis not present

## 2020-10-22 DIAGNOSIS — J9601 Acute respiratory failure with hypoxia: Secondary | ICD-10-CM | POA: Diagnosis not present

## 2020-10-22 DIAGNOSIS — D649 Anemia, unspecified: Secondary | ICD-10-CM | POA: Diagnosis not present

## 2020-10-22 DIAGNOSIS — I10 Essential (primary) hypertension: Secondary | ICD-10-CM | POA: Diagnosis not present

## 2020-10-22 DIAGNOSIS — J189 Pneumonia, unspecified organism: Secondary | ICD-10-CM | POA: Diagnosis not present

## 2020-10-22 DIAGNOSIS — I4891 Unspecified atrial fibrillation: Secondary | ICD-10-CM | POA: Diagnosis not present

## 2020-10-22 DIAGNOSIS — E039 Hypothyroidism, unspecified: Secondary | ICD-10-CM | POA: Diagnosis not present

## 2020-10-22 DIAGNOSIS — I50811 Acute right heart failure: Secondary | ICD-10-CM | POA: Diagnosis not present

## 2020-10-22 DIAGNOSIS — Z8719 Personal history of other diseases of the digestive system: Secondary | ICD-10-CM | POA: Diagnosis not present

## 2020-10-23 DIAGNOSIS — N1832 Chronic kidney disease, stage 3b: Secondary | ICD-10-CM | POA: Diagnosis not present

## 2020-10-23 DIAGNOSIS — J9601 Acute respiratory failure with hypoxia: Secondary | ICD-10-CM | POA: Diagnosis not present

## 2020-10-23 DIAGNOSIS — I10 Essential (primary) hypertension: Secondary | ICD-10-CM | POA: Diagnosis not present

## 2020-10-23 DIAGNOSIS — I50811 Acute right heart failure: Secondary | ICD-10-CM | POA: Diagnosis not present

## 2020-10-23 DIAGNOSIS — D649 Anemia, unspecified: Secondary | ICD-10-CM | POA: Diagnosis not present

## 2020-10-23 DIAGNOSIS — E039 Hypothyroidism, unspecified: Secondary | ICD-10-CM | POA: Diagnosis not present

## 2020-10-23 DIAGNOSIS — Z8719 Personal history of other diseases of the digestive system: Secondary | ICD-10-CM | POA: Diagnosis not present

## 2020-10-23 DIAGNOSIS — I4891 Unspecified atrial fibrillation: Secondary | ICD-10-CM | POA: Diagnosis not present

## 2020-10-23 DIAGNOSIS — J189 Pneumonia, unspecified organism: Secondary | ICD-10-CM | POA: Diagnosis not present

## 2020-10-24 DIAGNOSIS — N1832 Chronic kidney disease, stage 3b: Secondary | ICD-10-CM | POA: Diagnosis not present

## 2020-10-24 DIAGNOSIS — E039 Hypothyroidism, unspecified: Secondary | ICD-10-CM | POA: Diagnosis not present

## 2020-10-24 DIAGNOSIS — J189 Pneumonia, unspecified organism: Secondary | ICD-10-CM | POA: Diagnosis not present

## 2020-10-24 DIAGNOSIS — I10 Essential (primary) hypertension: Secondary | ICD-10-CM | POA: Diagnosis not present

## 2020-10-24 DIAGNOSIS — I50811 Acute right heart failure: Secondary | ICD-10-CM | POA: Diagnosis not present

## 2020-10-24 DIAGNOSIS — D649 Anemia, unspecified: Secondary | ICD-10-CM | POA: Diagnosis not present

## 2020-10-24 DIAGNOSIS — Z8719 Personal history of other diseases of the digestive system: Secondary | ICD-10-CM | POA: Diagnosis not present

## 2020-10-24 DIAGNOSIS — I4891 Unspecified atrial fibrillation: Secondary | ICD-10-CM | POA: Diagnosis not present

## 2020-10-24 DIAGNOSIS — J9601 Acute respiratory failure with hypoxia: Secondary | ICD-10-CM | POA: Diagnosis not present

## 2020-10-25 DIAGNOSIS — I10 Essential (primary) hypertension: Secondary | ICD-10-CM | POA: Diagnosis not present

## 2020-10-25 DIAGNOSIS — N1832 Chronic kidney disease, stage 3b: Secondary | ICD-10-CM | POA: Diagnosis not present

## 2020-10-25 DIAGNOSIS — D649 Anemia, unspecified: Secondary | ICD-10-CM | POA: Diagnosis not present

## 2020-10-25 DIAGNOSIS — J189 Pneumonia, unspecified organism: Secondary | ICD-10-CM | POA: Diagnosis not present

## 2020-10-25 DIAGNOSIS — Z8719 Personal history of other diseases of the digestive system: Secondary | ICD-10-CM | POA: Diagnosis not present

## 2020-10-25 DIAGNOSIS — J9601 Acute respiratory failure with hypoxia: Secondary | ICD-10-CM | POA: Diagnosis not present

## 2020-10-25 DIAGNOSIS — I4891 Unspecified atrial fibrillation: Secondary | ICD-10-CM | POA: Diagnosis not present

## 2020-10-25 DIAGNOSIS — E039 Hypothyroidism, unspecified: Secondary | ICD-10-CM | POA: Diagnosis not present

## 2020-10-25 DIAGNOSIS — I50811 Acute right heart failure: Secondary | ICD-10-CM | POA: Diagnosis not present

## 2020-10-31 DIAGNOSIS — E039 Hypothyroidism, unspecified: Secondary | ICD-10-CM | POA: Diagnosis not present

## 2020-10-31 DIAGNOSIS — M1990 Unspecified osteoarthritis, unspecified site: Secondary | ICD-10-CM | POA: Diagnosis not present

## 2020-10-31 DIAGNOSIS — D631 Anemia in chronic kidney disease: Secondary | ICD-10-CM | POA: Diagnosis not present

## 2020-10-31 DIAGNOSIS — K253 Acute gastric ulcer without hemorrhage or perforation: Secondary | ICD-10-CM | POA: Diagnosis not present

## 2020-10-31 DIAGNOSIS — I129 Hypertensive chronic kidney disease with stage 1 through stage 4 chronic kidney disease, or unspecified chronic kidney disease: Secondary | ICD-10-CM | POA: Diagnosis not present

## 2020-10-31 DIAGNOSIS — E119 Type 2 diabetes mellitus without complications: Secondary | ICD-10-CM | POA: Diagnosis not present

## 2020-10-31 DIAGNOSIS — N390 Urinary tract infection, site not specified: Secondary | ICD-10-CM | POA: Diagnosis not present

## 2020-10-31 DIAGNOSIS — N1832 Chronic kidney disease, stage 3b: Secondary | ICD-10-CM | POA: Diagnosis not present

## 2020-10-31 DIAGNOSIS — I4891 Unspecified atrial fibrillation: Secondary | ICD-10-CM | POA: Diagnosis not present

## 2020-11-04 DIAGNOSIS — E039 Hypothyroidism, unspecified: Secondary | ICD-10-CM | POA: Diagnosis not present

## 2020-11-04 DIAGNOSIS — K253 Acute gastric ulcer without hemorrhage or perforation: Secondary | ICD-10-CM | POA: Diagnosis not present

## 2020-11-04 DIAGNOSIS — I4891 Unspecified atrial fibrillation: Secondary | ICD-10-CM | POA: Diagnosis not present

## 2020-11-04 DIAGNOSIS — I129 Hypertensive chronic kidney disease with stage 1 through stage 4 chronic kidney disease, or unspecified chronic kidney disease: Secondary | ICD-10-CM | POA: Diagnosis not present

## 2020-11-04 DIAGNOSIS — N1832 Chronic kidney disease, stage 3b: Secondary | ICD-10-CM | POA: Diagnosis not present

## 2020-11-04 DIAGNOSIS — E119 Type 2 diabetes mellitus without complications: Secondary | ICD-10-CM | POA: Diagnosis not present

## 2020-11-04 DIAGNOSIS — M1990 Unspecified osteoarthritis, unspecified site: Secondary | ICD-10-CM | POA: Diagnosis not present

## 2020-11-04 DIAGNOSIS — D631 Anemia in chronic kidney disease: Secondary | ICD-10-CM | POA: Diagnosis not present

## 2020-11-04 DIAGNOSIS — N390 Urinary tract infection, site not specified: Secondary | ICD-10-CM | POA: Diagnosis not present

## 2020-11-06 DIAGNOSIS — N1832 Chronic kidney disease, stage 3b: Secondary | ICD-10-CM | POA: Diagnosis not present

## 2020-11-06 DIAGNOSIS — K253 Acute gastric ulcer without hemorrhage or perforation: Secondary | ICD-10-CM | POA: Diagnosis not present

## 2020-11-06 DIAGNOSIS — I4891 Unspecified atrial fibrillation: Secondary | ICD-10-CM | POA: Diagnosis not present

## 2020-11-06 DIAGNOSIS — I129 Hypertensive chronic kidney disease with stage 1 through stage 4 chronic kidney disease, or unspecified chronic kidney disease: Secondary | ICD-10-CM | POA: Diagnosis not present

## 2020-11-06 DIAGNOSIS — E039 Hypothyroidism, unspecified: Secondary | ICD-10-CM | POA: Diagnosis not present

## 2020-11-06 DIAGNOSIS — D631 Anemia in chronic kidney disease: Secondary | ICD-10-CM | POA: Diagnosis not present

## 2020-11-06 DIAGNOSIS — N390 Urinary tract infection, site not specified: Secondary | ICD-10-CM | POA: Diagnosis not present

## 2020-11-06 DIAGNOSIS — M1990 Unspecified osteoarthritis, unspecified site: Secondary | ICD-10-CM | POA: Diagnosis not present

## 2020-11-06 DIAGNOSIS — E119 Type 2 diabetes mellitus without complications: Secondary | ICD-10-CM | POA: Diagnosis not present

## 2020-11-07 DIAGNOSIS — N1832 Chronic kidney disease, stage 3b: Secondary | ICD-10-CM | POA: Diagnosis not present

## 2020-11-07 DIAGNOSIS — K253 Acute gastric ulcer without hemorrhage or perforation: Secondary | ICD-10-CM | POA: Diagnosis not present

## 2020-11-07 DIAGNOSIS — E119 Type 2 diabetes mellitus without complications: Secondary | ICD-10-CM | POA: Diagnosis not present

## 2020-11-07 DIAGNOSIS — D631 Anemia in chronic kidney disease: Secondary | ICD-10-CM | POA: Diagnosis not present

## 2020-11-07 DIAGNOSIS — R7401 Elevation of levels of liver transaminase levels: Secondary | ICD-10-CM | POA: Diagnosis not present

## 2020-11-07 DIAGNOSIS — I4891 Unspecified atrial fibrillation: Secondary | ICD-10-CM | POA: Diagnosis not present

## 2020-11-07 DIAGNOSIS — E039 Hypothyroidism, unspecified: Secondary | ICD-10-CM | POA: Diagnosis not present

## 2020-11-07 DIAGNOSIS — M1990 Unspecified osteoarthritis, unspecified site: Secondary | ICD-10-CM | POA: Diagnosis not present

## 2020-11-07 DIAGNOSIS — I129 Hypertensive chronic kidney disease with stage 1 through stage 4 chronic kidney disease, or unspecified chronic kidney disease: Secondary | ICD-10-CM | POA: Diagnosis not present

## 2020-11-07 DIAGNOSIS — D509 Iron deficiency anemia, unspecified: Secondary | ICD-10-CM | POA: Diagnosis not present

## 2020-11-07 DIAGNOSIS — N390 Urinary tract infection, site not specified: Secondary | ICD-10-CM | POA: Diagnosis not present

## 2020-11-08 DIAGNOSIS — I4891 Unspecified atrial fibrillation: Secondary | ICD-10-CM | POA: Diagnosis not present

## 2020-11-08 DIAGNOSIS — M1990 Unspecified osteoarthritis, unspecified site: Secondary | ICD-10-CM | POA: Diagnosis not present

## 2020-11-08 DIAGNOSIS — I129 Hypertensive chronic kidney disease with stage 1 through stage 4 chronic kidney disease, or unspecified chronic kidney disease: Secondary | ICD-10-CM | POA: Diagnosis not present

## 2020-11-08 DIAGNOSIS — D631 Anemia in chronic kidney disease: Secondary | ICD-10-CM | POA: Diagnosis not present

## 2020-11-08 DIAGNOSIS — E119 Type 2 diabetes mellitus without complications: Secondary | ICD-10-CM | POA: Diagnosis not present

## 2020-11-08 DIAGNOSIS — N1832 Chronic kidney disease, stage 3b: Secondary | ICD-10-CM | POA: Diagnosis not present

## 2020-11-08 DIAGNOSIS — E039 Hypothyroidism, unspecified: Secondary | ICD-10-CM | POA: Diagnosis not present

## 2020-11-08 DIAGNOSIS — N390 Urinary tract infection, site not specified: Secondary | ICD-10-CM | POA: Diagnosis not present

## 2020-11-08 DIAGNOSIS — K253 Acute gastric ulcer without hemorrhage or perforation: Secondary | ICD-10-CM | POA: Diagnosis not present

## 2020-11-11 DIAGNOSIS — N1832 Chronic kidney disease, stage 3b: Secondary | ICD-10-CM | POA: Diagnosis not present

## 2020-11-11 DIAGNOSIS — M1990 Unspecified osteoarthritis, unspecified site: Secondary | ICD-10-CM | POA: Diagnosis not present

## 2020-11-11 DIAGNOSIS — E119 Type 2 diabetes mellitus without complications: Secondary | ICD-10-CM | POA: Diagnosis not present

## 2020-11-11 DIAGNOSIS — K253 Acute gastric ulcer without hemorrhage or perforation: Secondary | ICD-10-CM | POA: Diagnosis not present

## 2020-11-11 DIAGNOSIS — D631 Anemia in chronic kidney disease: Secondary | ICD-10-CM | POA: Diagnosis not present

## 2020-11-11 DIAGNOSIS — N390 Urinary tract infection, site not specified: Secondary | ICD-10-CM | POA: Diagnosis not present

## 2020-11-11 DIAGNOSIS — I129 Hypertensive chronic kidney disease with stage 1 through stage 4 chronic kidney disease, or unspecified chronic kidney disease: Secondary | ICD-10-CM | POA: Diagnosis not present

## 2020-11-11 DIAGNOSIS — I4891 Unspecified atrial fibrillation: Secondary | ICD-10-CM | POA: Diagnosis not present

## 2020-11-11 DIAGNOSIS — E039 Hypothyroidism, unspecified: Secondary | ICD-10-CM | POA: Diagnosis not present

## 2020-11-14 DIAGNOSIS — K253 Acute gastric ulcer without hemorrhage or perforation: Secondary | ICD-10-CM | POA: Diagnosis not present

## 2020-11-14 DIAGNOSIS — E119 Type 2 diabetes mellitus without complications: Secondary | ICD-10-CM | POA: Diagnosis not present

## 2020-11-14 DIAGNOSIS — I129 Hypertensive chronic kidney disease with stage 1 through stage 4 chronic kidney disease, or unspecified chronic kidney disease: Secondary | ICD-10-CM | POA: Diagnosis not present

## 2020-11-14 DIAGNOSIS — M1990 Unspecified osteoarthritis, unspecified site: Secondary | ICD-10-CM | POA: Diagnosis not present

## 2020-11-14 DIAGNOSIS — D631 Anemia in chronic kidney disease: Secondary | ICD-10-CM | POA: Diagnosis not present

## 2020-11-14 DIAGNOSIS — E039 Hypothyroidism, unspecified: Secondary | ICD-10-CM | POA: Diagnosis not present

## 2020-11-14 DIAGNOSIS — N390 Urinary tract infection, site not specified: Secondary | ICD-10-CM | POA: Diagnosis not present

## 2020-11-14 DIAGNOSIS — N1832 Chronic kidney disease, stage 3b: Secondary | ICD-10-CM | POA: Diagnosis not present

## 2020-11-14 DIAGNOSIS — I4891 Unspecified atrial fibrillation: Secondary | ICD-10-CM | POA: Diagnosis not present

## 2020-11-19 DIAGNOSIS — E119 Type 2 diabetes mellitus without complications: Secondary | ICD-10-CM | POA: Diagnosis not present

## 2020-11-19 DIAGNOSIS — D631 Anemia in chronic kidney disease: Secondary | ICD-10-CM | POA: Diagnosis not present

## 2020-11-19 DIAGNOSIS — I4891 Unspecified atrial fibrillation: Secondary | ICD-10-CM | POA: Diagnosis not present

## 2020-11-19 DIAGNOSIS — N390 Urinary tract infection, site not specified: Secondary | ICD-10-CM | POA: Diagnosis not present

## 2020-11-19 DIAGNOSIS — N1832 Chronic kidney disease, stage 3b: Secondary | ICD-10-CM | POA: Diagnosis not present

## 2020-11-19 DIAGNOSIS — M1990 Unspecified osteoarthritis, unspecified site: Secondary | ICD-10-CM | POA: Diagnosis not present

## 2020-11-19 DIAGNOSIS — I129 Hypertensive chronic kidney disease with stage 1 through stage 4 chronic kidney disease, or unspecified chronic kidney disease: Secondary | ICD-10-CM | POA: Diagnosis not present

## 2020-11-19 DIAGNOSIS — E039 Hypothyroidism, unspecified: Secondary | ICD-10-CM | POA: Diagnosis not present

## 2020-11-19 DIAGNOSIS — K253 Acute gastric ulcer without hemorrhage or perforation: Secondary | ICD-10-CM | POA: Diagnosis not present

## 2020-11-20 DIAGNOSIS — N1832 Chronic kidney disease, stage 3b: Secondary | ICD-10-CM | POA: Diagnosis not present

## 2020-11-20 DIAGNOSIS — N390 Urinary tract infection, site not specified: Secondary | ICD-10-CM | POA: Diagnosis not present

## 2020-11-20 DIAGNOSIS — E039 Hypothyroidism, unspecified: Secondary | ICD-10-CM | POA: Diagnosis not present

## 2020-11-20 DIAGNOSIS — I129 Hypertensive chronic kidney disease with stage 1 through stage 4 chronic kidney disease, or unspecified chronic kidney disease: Secondary | ICD-10-CM | POA: Diagnosis not present

## 2020-11-20 DIAGNOSIS — I4891 Unspecified atrial fibrillation: Secondary | ICD-10-CM | POA: Diagnosis not present

## 2020-11-20 DIAGNOSIS — K253 Acute gastric ulcer without hemorrhage or perforation: Secondary | ICD-10-CM | POA: Diagnosis not present

## 2020-11-20 DIAGNOSIS — M1990 Unspecified osteoarthritis, unspecified site: Secondary | ICD-10-CM | POA: Diagnosis not present

## 2020-11-20 DIAGNOSIS — E119 Type 2 diabetes mellitus without complications: Secondary | ICD-10-CM | POA: Diagnosis not present

## 2020-11-20 DIAGNOSIS — D631 Anemia in chronic kidney disease: Secondary | ICD-10-CM | POA: Diagnosis not present

## 2020-11-21 DIAGNOSIS — K250 Acute gastric ulcer with hemorrhage: Secondary | ICD-10-CM | POA: Diagnosis not present

## 2020-11-26 DIAGNOSIS — N1832 Chronic kidney disease, stage 3b: Secondary | ICD-10-CM | POA: Diagnosis not present

## 2020-11-26 DIAGNOSIS — K253 Acute gastric ulcer without hemorrhage or perforation: Secondary | ICD-10-CM | POA: Diagnosis not present

## 2020-11-26 DIAGNOSIS — E039 Hypothyroidism, unspecified: Secondary | ICD-10-CM | POA: Diagnosis not present

## 2020-11-26 DIAGNOSIS — E119 Type 2 diabetes mellitus without complications: Secondary | ICD-10-CM | POA: Diagnosis not present

## 2020-11-26 DIAGNOSIS — M1990 Unspecified osteoarthritis, unspecified site: Secondary | ICD-10-CM | POA: Diagnosis not present

## 2020-11-26 DIAGNOSIS — I4891 Unspecified atrial fibrillation: Secondary | ICD-10-CM | POA: Diagnosis not present

## 2020-11-26 DIAGNOSIS — I129 Hypertensive chronic kidney disease with stage 1 through stage 4 chronic kidney disease, or unspecified chronic kidney disease: Secondary | ICD-10-CM | POA: Diagnosis not present

## 2020-11-26 DIAGNOSIS — N390 Urinary tract infection, site not specified: Secondary | ICD-10-CM | POA: Diagnosis not present

## 2020-11-26 DIAGNOSIS — D631 Anemia in chronic kidney disease: Secondary | ICD-10-CM | POA: Diagnosis not present

## 2020-11-27 DIAGNOSIS — N1832 Chronic kidney disease, stage 3b: Secondary | ICD-10-CM | POA: Diagnosis not present

## 2020-11-27 DIAGNOSIS — N390 Urinary tract infection, site not specified: Secondary | ICD-10-CM | POA: Diagnosis not present

## 2020-11-27 DIAGNOSIS — M1990 Unspecified osteoarthritis, unspecified site: Secondary | ICD-10-CM | POA: Diagnosis not present

## 2020-11-27 DIAGNOSIS — K253 Acute gastric ulcer without hemorrhage or perforation: Secondary | ICD-10-CM | POA: Diagnosis not present

## 2020-11-27 DIAGNOSIS — E039 Hypothyroidism, unspecified: Secondary | ICD-10-CM | POA: Diagnosis not present

## 2020-11-27 DIAGNOSIS — I129 Hypertensive chronic kidney disease with stage 1 through stage 4 chronic kidney disease, or unspecified chronic kidney disease: Secondary | ICD-10-CM | POA: Diagnosis not present

## 2020-11-27 DIAGNOSIS — E119 Type 2 diabetes mellitus without complications: Secondary | ICD-10-CM | POA: Diagnosis not present

## 2020-11-27 DIAGNOSIS — I4891 Unspecified atrial fibrillation: Secondary | ICD-10-CM | POA: Diagnosis not present

## 2020-11-27 DIAGNOSIS — D631 Anemia in chronic kidney disease: Secondary | ICD-10-CM | POA: Diagnosis not present

## 2020-11-28 DIAGNOSIS — I480 Paroxysmal atrial fibrillation: Secondary | ICD-10-CM | POA: Diagnosis not present

## 2020-11-28 DIAGNOSIS — M1990 Unspecified osteoarthritis, unspecified site: Secondary | ICD-10-CM | POA: Diagnosis not present

## 2020-11-28 DIAGNOSIS — I130 Hypertensive heart and chronic kidney disease with heart failure and stage 1 through stage 4 chronic kidney disease, or unspecified chronic kidney disease: Secondary | ICD-10-CM | POA: Diagnosis not present

## 2020-11-28 DIAGNOSIS — I25118 Atherosclerotic heart disease of native coronary artery with other forms of angina pectoris: Secondary | ICD-10-CM | POA: Diagnosis not present

## 2020-11-28 DIAGNOSIS — E039 Hypothyroidism, unspecified: Secondary | ICD-10-CM | POA: Diagnosis not present

## 2020-11-28 DIAGNOSIS — E1122 Type 2 diabetes mellitus with diabetic chronic kidney disease: Secondary | ICD-10-CM | POA: Diagnosis not present

## 2020-11-28 DIAGNOSIS — I495 Sick sinus syndrome: Secondary | ICD-10-CM | POA: Diagnosis not present

## 2020-11-28 DIAGNOSIS — E119 Type 2 diabetes mellitus without complications: Secondary | ICD-10-CM | POA: Diagnosis not present

## 2020-11-28 DIAGNOSIS — D631 Anemia in chronic kidney disease: Secondary | ICD-10-CM | POA: Diagnosis not present

## 2020-11-28 DIAGNOSIS — I4891 Unspecified atrial fibrillation: Secondary | ICD-10-CM | POA: Diagnosis not present

## 2020-11-28 DIAGNOSIS — N1831 Chronic kidney disease, stage 3a: Secondary | ICD-10-CM | POA: Diagnosis not present

## 2020-11-28 DIAGNOSIS — K219 Gastro-esophageal reflux disease without esophagitis: Secondary | ICD-10-CM | POA: Diagnosis not present

## 2020-11-28 DIAGNOSIS — M7989 Other specified soft tissue disorders: Secondary | ICD-10-CM | POA: Diagnosis not present

## 2020-11-28 DIAGNOSIS — N184 Chronic kidney disease, stage 4 (severe): Secondary | ICD-10-CM | POA: Diagnosis not present

## 2020-11-28 DIAGNOSIS — I129 Hypertensive chronic kidney disease with stage 1 through stage 4 chronic kidney disease, or unspecified chronic kidney disease: Secondary | ICD-10-CM | POA: Diagnosis not present

## 2020-11-28 DIAGNOSIS — I509 Heart failure, unspecified: Secondary | ICD-10-CM | POA: Diagnosis not present

## 2020-11-28 DIAGNOSIS — Z952 Presence of prosthetic heart valve: Secondary | ICD-10-CM | POA: Diagnosis not present

## 2020-12-04 DIAGNOSIS — E039 Hypothyroidism, unspecified: Secondary | ICD-10-CM | POA: Diagnosis not present

## 2020-12-04 DIAGNOSIS — N184 Chronic kidney disease, stage 4 (severe): Secondary | ICD-10-CM | POA: Diagnosis not present

## 2020-12-04 DIAGNOSIS — E1122 Type 2 diabetes mellitus with diabetic chronic kidney disease: Secondary | ICD-10-CM | POA: Diagnosis not present

## 2020-12-04 DIAGNOSIS — K219 Gastro-esophageal reflux disease without esophagitis: Secondary | ICD-10-CM | POA: Diagnosis not present

## 2020-12-04 DIAGNOSIS — I509 Heart failure, unspecified: Secondary | ICD-10-CM | POA: Diagnosis not present

## 2020-12-04 DIAGNOSIS — D631 Anemia in chronic kidney disease: Secondary | ICD-10-CM | POA: Diagnosis not present

## 2020-12-04 DIAGNOSIS — I4891 Unspecified atrial fibrillation: Secondary | ICD-10-CM | POA: Diagnosis not present

## 2020-12-04 DIAGNOSIS — I130 Hypertensive heart and chronic kidney disease with heart failure and stage 1 through stage 4 chronic kidney disease, or unspecified chronic kidney disease: Secondary | ICD-10-CM | POA: Diagnosis not present

## 2020-12-04 DIAGNOSIS — H409 Unspecified glaucoma: Secondary | ICD-10-CM | POA: Diagnosis not present

## 2020-12-09 DIAGNOSIS — N184 Chronic kidney disease, stage 4 (severe): Secondary | ICD-10-CM | POA: Diagnosis not present

## 2020-12-09 DIAGNOSIS — I130 Hypertensive heart and chronic kidney disease with heart failure and stage 1 through stage 4 chronic kidney disease, or unspecified chronic kidney disease: Secondary | ICD-10-CM | POA: Diagnosis not present

## 2020-12-09 DIAGNOSIS — J159 Unspecified bacterial pneumonia: Secondary | ICD-10-CM | POA: Diagnosis not present

## 2020-12-09 DIAGNOSIS — I700 Atherosclerosis of aorta: Secondary | ICD-10-CM | POA: Diagnosis not present

## 2020-12-09 DIAGNOSIS — D631 Anemia in chronic kidney disease: Secondary | ICD-10-CM | POA: Diagnosis not present

## 2020-12-09 DIAGNOSIS — I480 Paroxysmal atrial fibrillation: Secondary | ICD-10-CM | POA: Diagnosis not present

## 2020-12-09 DIAGNOSIS — J849 Interstitial pulmonary disease, unspecified: Secondary | ICD-10-CM | POA: Diagnosis not present

## 2020-12-09 DIAGNOSIS — K7460 Unspecified cirrhosis of liver: Secondary | ICD-10-CM | POA: Diagnosis not present

## 2020-12-09 DIAGNOSIS — R7989 Other specified abnormal findings of blood chemistry: Secondary | ICD-10-CM | POA: Diagnosis not present

## 2020-12-09 DIAGNOSIS — E1122 Type 2 diabetes mellitus with diabetic chronic kidney disease: Secondary | ICD-10-CM | POA: Diagnosis not present

## 2020-12-09 DIAGNOSIS — N183 Chronic kidney disease, stage 3 unspecified: Secondary | ICD-10-CM | POA: Diagnosis not present

## 2020-12-09 DIAGNOSIS — Z950 Presence of cardiac pacemaker: Secondary | ICD-10-CM | POA: Diagnosis not present

## 2020-12-09 DIAGNOSIS — R791 Abnormal coagulation profile: Secondary | ICD-10-CM | POA: Diagnosis not present

## 2020-12-09 DIAGNOSIS — R942 Abnormal results of pulmonary function studies: Secondary | ICD-10-CM | POA: Diagnosis not present

## 2020-12-09 DIAGNOSIS — I517 Cardiomegaly: Secondary | ICD-10-CM | POA: Diagnosis not present

## 2020-12-09 DIAGNOSIS — N179 Acute kidney failure, unspecified: Secondary | ICD-10-CM | POA: Diagnosis not present

## 2020-12-09 DIAGNOSIS — N261 Atrophy of kidney (terminal): Secondary | ICD-10-CM | POA: Diagnosis not present

## 2020-12-09 DIAGNOSIS — D500 Iron deficiency anemia secondary to blood loss (chronic): Secondary | ICD-10-CM | POA: Diagnosis not present

## 2020-12-09 DIAGNOSIS — J984 Other disorders of lung: Secondary | ICD-10-CM | POA: Diagnosis not present

## 2020-12-09 DIAGNOSIS — K8020 Calculus of gallbladder without cholecystitis without obstruction: Secondary | ICD-10-CM | POA: Diagnosis not present

## 2020-12-09 DIAGNOSIS — E039 Hypothyroidism, unspecified: Secondary | ICD-10-CM | POA: Diagnosis not present

## 2020-12-09 DIAGNOSIS — I509 Heart failure, unspecified: Secondary | ICD-10-CM | POA: Diagnosis not present

## 2020-12-09 DIAGNOSIS — I4891 Unspecified atrial fibrillation: Secondary | ICD-10-CM | POA: Diagnosis not present

## 2020-12-09 DIAGNOSIS — Z48813 Encounter for surgical aftercare following surgery on the respiratory system: Secondary | ICD-10-CM | POA: Diagnosis not present

## 2020-12-09 DIAGNOSIS — H409 Unspecified glaucoma: Secondary | ICD-10-CM | POA: Diagnosis not present

## 2020-12-09 DIAGNOSIS — N1832 Chronic kidney disease, stage 3b: Secondary | ICD-10-CM | POA: Diagnosis not present

## 2020-12-09 DIAGNOSIS — R112 Nausea with vomiting, unspecified: Secondary | ICD-10-CM | POA: Diagnosis not present

## 2020-12-09 DIAGNOSIS — J9601 Acute respiratory failure with hypoxia: Secondary | ICD-10-CM | POA: Diagnosis not present

## 2020-12-09 DIAGNOSIS — Z952 Presence of prosthetic heart valve: Secondary | ICD-10-CM | POA: Diagnosis not present

## 2020-12-09 DIAGNOSIS — R5383 Other fatigue: Secondary | ICD-10-CM | POA: Diagnosis not present

## 2020-12-09 DIAGNOSIS — J189 Pneumonia, unspecified organism: Secondary | ICD-10-CM | POA: Diagnosis not present

## 2020-12-09 DIAGNOSIS — Z7189 Other specified counseling: Secondary | ICD-10-CM | POA: Diagnosis not present

## 2020-12-09 DIAGNOSIS — J90 Pleural effusion, not elsewhere classified: Secondary | ICD-10-CM | POA: Diagnosis not present

## 2020-12-09 DIAGNOSIS — Z20822 Contact with and (suspected) exposure to covid-19: Secondary | ICD-10-CM | POA: Diagnosis not present

## 2020-12-09 DIAGNOSIS — J181 Lobar pneumonia, unspecified organism: Secondary | ICD-10-CM | POA: Diagnosis not present

## 2020-12-09 DIAGNOSIS — K219 Gastro-esophageal reflux disease without esophagitis: Secondary | ICD-10-CM | POA: Diagnosis not present

## 2020-12-09 DIAGNOSIS — R918 Other nonspecific abnormal finding of lung field: Secondary | ICD-10-CM | POA: Diagnosis not present

## 2020-12-09 DIAGNOSIS — R0689 Other abnormalities of breathing: Secondary | ICD-10-CM | POA: Diagnosis not present

## 2020-12-09 DIAGNOSIS — I2510 Atherosclerotic heart disease of native coronary artery without angina pectoris: Secondary | ICD-10-CM | POA: Diagnosis not present

## 2020-12-09 DIAGNOSIS — I10 Essential (primary) hypertension: Secondary | ICD-10-CM | POA: Diagnosis not present

## 2020-12-09 DIAGNOSIS — I129 Hypertensive chronic kidney disease with stage 1 through stage 4 chronic kidney disease, or unspecified chronic kidney disease: Secondary | ICD-10-CM | POA: Diagnosis not present

## 2020-12-09 DIAGNOSIS — R6510 Systemic inflammatory response syndrome (SIRS) of non-infectious origin without acute organ dysfunction: Secondary | ICD-10-CM | POA: Diagnosis not present

## 2020-12-09 DIAGNOSIS — R0902 Hypoxemia: Secondary | ICD-10-CM | POA: Diagnosis not present

## 2021-01-12 DEATH — deceased

## 2023-04-09 IMAGING — CR DG LUMBAR SPINE COMPLETE 4+V
5 series · 5 of 5 positions shown · non-contrast
Comparison: None.

CLINICAL DATA: Atraumatic lower left back pain.

EXAM:
LUMBAR SPINE - COMPLETE 4+ VIEW

[t lumbar spine ap]
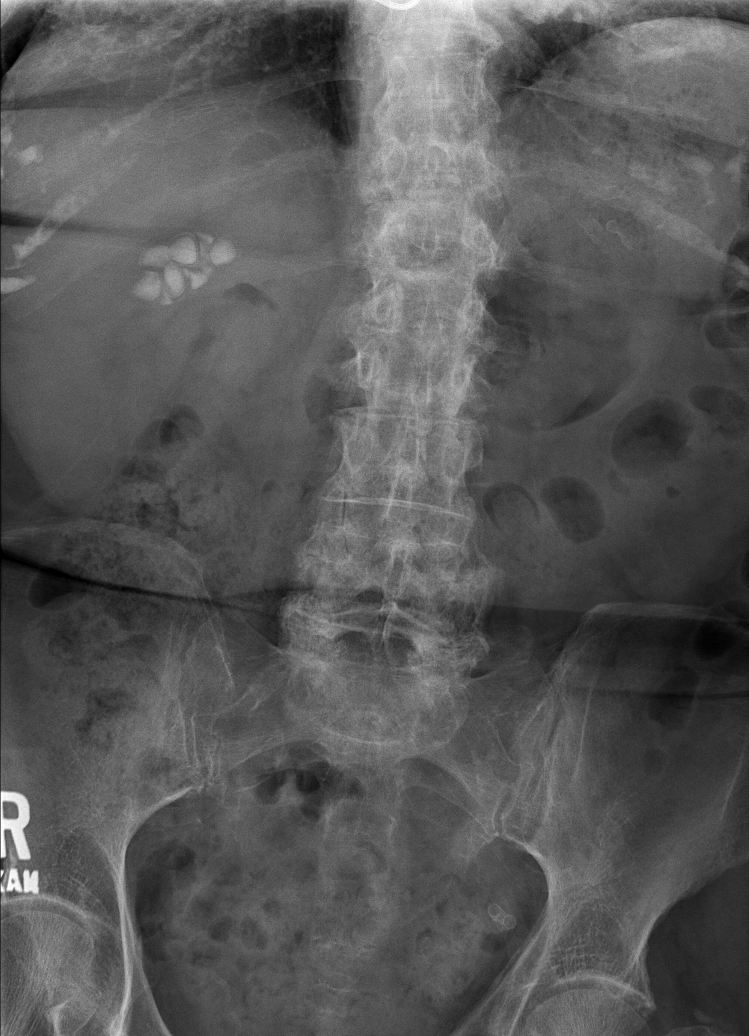

[t lumbar spine obl (1 of 2)]
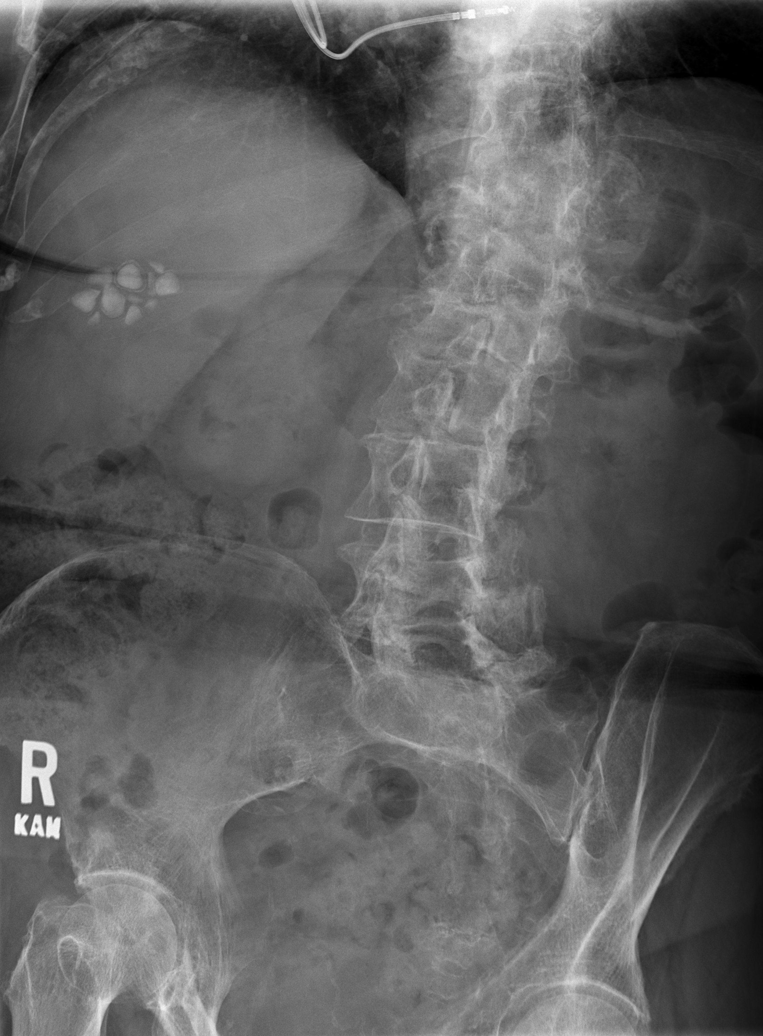

[t lumbar spine obl (2 of 2)]
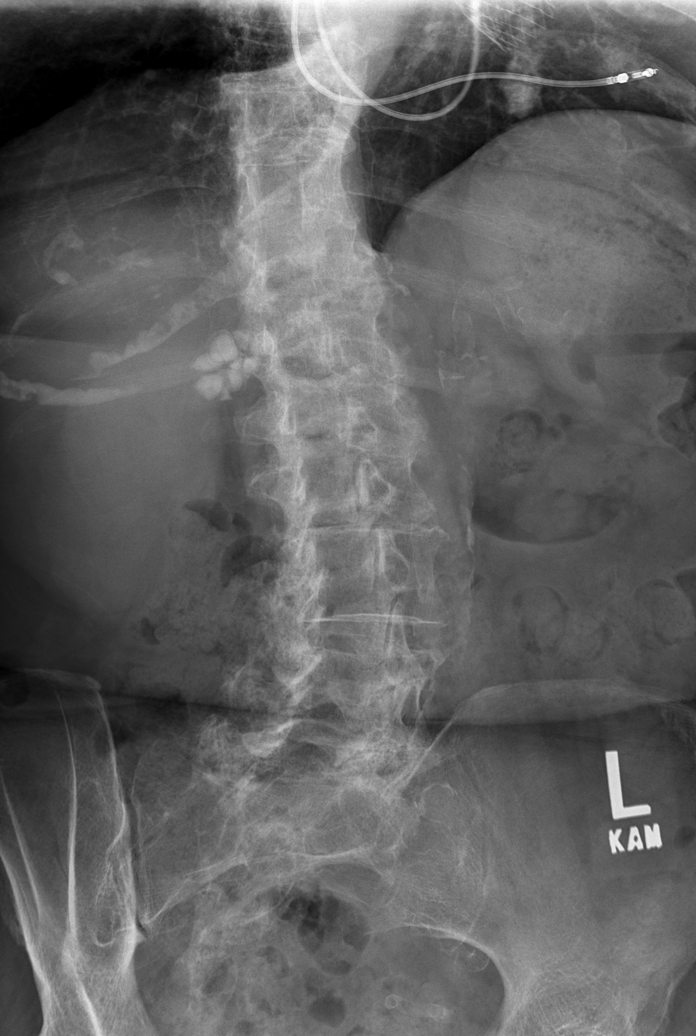

[t lumbar spine lat]
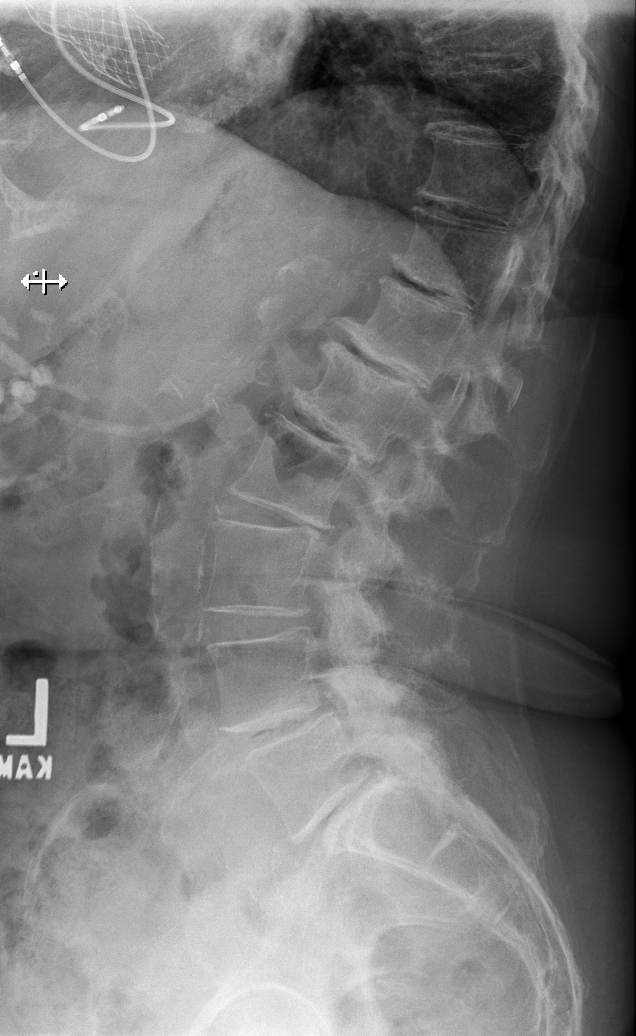

[t lumbar l-5 s-1 spot]
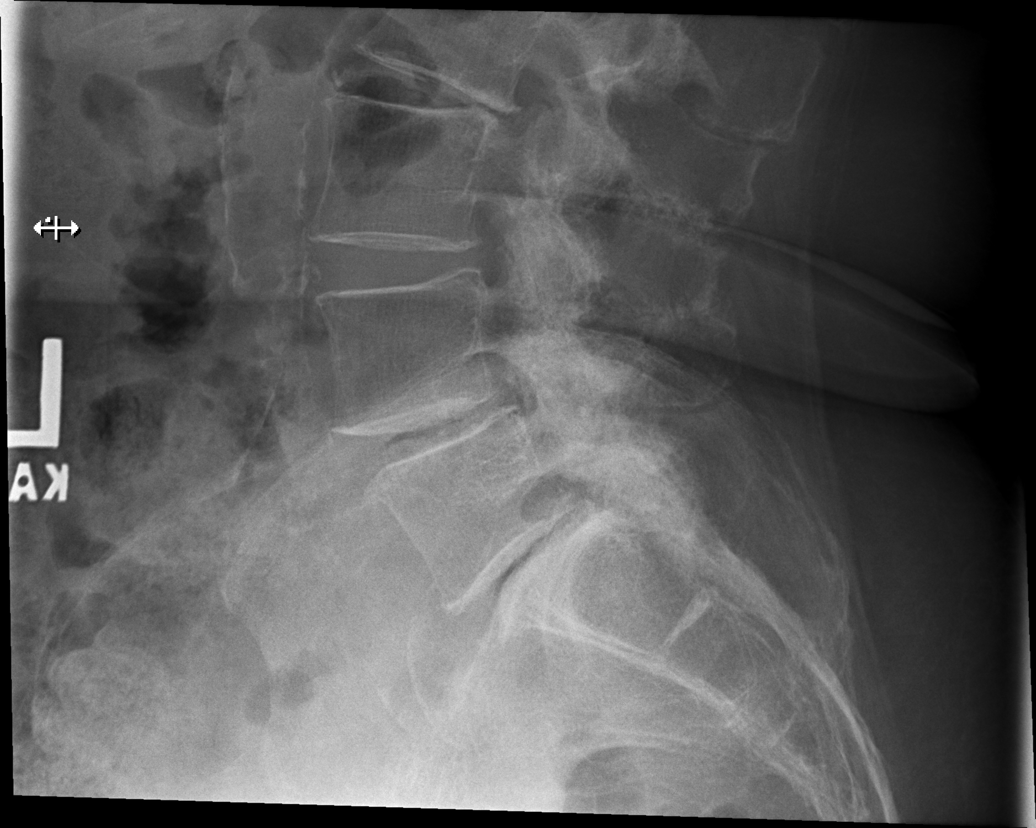

[5 of 5 positions shown; findings below may reference images not displayed]

FINDINGS: There is no evidence of an acute lumbar spine fracture.
Approximately 3 mm retrolisthesis of the L1 vertebral body is noted
on L2, with 1 mm retrolisthesis of L2 vertebral body on L3. There is
2 mm anterolisthesis of the L4 vertebral body on L5. Marked severity
endplate sclerosis is seen at the levels of T12-L1 and L1-L2. Mild
endplate sclerosis is noted throughout the remainder of the lumbar
spine. Moderate to marked severity intervertebral disc space
narrowing is seen at the levels of T12-L1 and L1-L2 with moderate
severity intervertebral disc space narrowing at the levels of L4-L5
and L5-S1.
IMPRESSION: Moderate to marked severity multilevel degenerative changes, as
described above.
